# Patient Record
Sex: Female | Born: 1945 | Race: White | Hispanic: No | Marital: Married | Smoking: Former smoker
Health system: Southern US, Community
[De-identification: ages and names within clinical notes are randomized; demographics above are authoritative.]

## PROBLEM LIST (undated history)

## (undated) DIAGNOSIS — J329 Chronic sinusitis, unspecified: Secondary | ICD-10-CM

## (undated) DIAGNOSIS — M549 Dorsalgia, unspecified: Secondary | ICD-10-CM

## (undated) DIAGNOSIS — Z9889 Other specified postprocedural states: Secondary | ICD-10-CM

## (undated) DIAGNOSIS — M7989 Other specified soft tissue disorders: Secondary | ICD-10-CM

## (undated) DIAGNOSIS — M545 Low back pain, unspecified: Secondary | ICD-10-CM

## (undated) DIAGNOSIS — E7800 Pure hypercholesterolemia, unspecified: Secondary | ICD-10-CM

## (undated) DIAGNOSIS — E669 Obesity, unspecified: Secondary | ICD-10-CM

## (undated) DIAGNOSIS — R002 Palpitations: Secondary | ICD-10-CM

## (undated) DIAGNOSIS — Z8679 Personal history of other diseases of the circulatory system: Secondary | ICD-10-CM

## (undated) DIAGNOSIS — M2550 Pain in unspecified joint: Secondary | ICD-10-CM

## (undated) DIAGNOSIS — M1990 Unspecified osteoarthritis, unspecified site: Secondary | ICD-10-CM

## (undated) DIAGNOSIS — K5900 Constipation, unspecified: Secondary | ICD-10-CM

## (undated) DIAGNOSIS — E559 Vitamin D deficiency, unspecified: Secondary | ICD-10-CM

## (undated) DIAGNOSIS — M154 Erosive (osteo)arthritis: Secondary | ICD-10-CM

## (undated) DIAGNOSIS — R112 Nausea with vomiting, unspecified: Secondary | ICD-10-CM

## (undated) DIAGNOSIS — R0602 Shortness of breath: Secondary | ICD-10-CM

## (undated) DIAGNOSIS — G4700 Insomnia, unspecified: Secondary | ICD-10-CM

## (undated) DIAGNOSIS — I10 Essential (primary) hypertension: Secondary | ICD-10-CM

## (undated) DIAGNOSIS — H04129 Dry eye syndrome of unspecified lacrimal gland: Secondary | ICD-10-CM

## (undated) HISTORY — DX: Insomnia, unspecified: G47.00

## (undated) HISTORY — DX: Dorsalgia, unspecified: M54.9

## (undated) HISTORY — DX: Vitamin D deficiency, unspecified: E55.9

## (undated) HISTORY — DX: Palpitations: R00.2

## (undated) HISTORY — PX: EYE SURGERY: SHX253

## (undated) HISTORY — PX: BREAST EXCISIONAL BIOPSY: SUR124

## (undated) HISTORY — DX: Shortness of breath: R06.02

## (undated) HISTORY — DX: Pain in unspecified joint: M25.50

## (undated) HISTORY — DX: Other specified soft tissue disorders: M79.89

## (undated) HISTORY — DX: Obesity, unspecified: E66.9

## (undated) HISTORY — PX: NECK SURGERY: SHX720

## (undated) HISTORY — DX: Dry eye syndrome of unspecified lacrimal gland: H04.129

## (undated) HISTORY — DX: Erosive (osteo)arthritis: M15.4

## (undated) HISTORY — DX: Constipation, unspecified: K59.00

## (undated) HISTORY — PX: TUBAL LIGATION: SHX77

## (undated) HISTORY — DX: Low back pain, unspecified: M54.50

## (undated) HISTORY — DX: Pure hypercholesterolemia, unspecified: E78.00

## (undated) HISTORY — PX: BREAST BIOPSY: SHX20

## (undated) HISTORY — PX: OTHER SURGICAL HISTORY: SHX169

---

## 1998-01-28 ENCOUNTER — Ambulatory Visit (HOSPITAL_COMMUNITY): Admission: RE | Admit: 1998-01-28 | Discharge: 1998-01-28 | Payer: Self-pay | Admitting: Obstetrics and Gynecology

## 1998-09-19 DIAGNOSIS — Z8679 Personal history of other diseases of the circulatory system: Secondary | ICD-10-CM

## 1998-09-19 HISTORY — DX: Personal history of other diseases of the circulatory system: Z86.79

## 1998-12-10 ENCOUNTER — Other Ambulatory Visit: Admission: RE | Admit: 1998-12-10 | Discharge: 1998-12-10 | Payer: Self-pay | Admitting: Obstetrics and Gynecology

## 1999-01-18 ENCOUNTER — Emergency Department (HOSPITAL_COMMUNITY): Admission: EM | Admit: 1999-01-18 | Discharge: 1999-01-18 | Payer: Self-pay | Admitting: Emergency Medicine

## 1999-03-19 ENCOUNTER — Ambulatory Visit (HOSPITAL_COMMUNITY): Admission: RE | Admit: 1999-03-19 | Discharge: 1999-03-19 | Payer: Self-pay | Admitting: Obstetrics and Gynecology

## 1999-08-29 ENCOUNTER — Emergency Department (HOSPITAL_COMMUNITY): Admission: EM | Admit: 1999-08-29 | Discharge: 1999-08-29 | Payer: Self-pay | Admitting: *Deleted

## 1999-12-15 ENCOUNTER — Encounter: Payer: Self-pay | Admitting: Internal Medicine

## 1999-12-15 ENCOUNTER — Ambulatory Visit (HOSPITAL_COMMUNITY): Admission: RE | Admit: 1999-12-15 | Discharge: 1999-12-16 | Payer: Self-pay | Admitting: Neurology

## 2000-04-07 ENCOUNTER — Encounter: Payer: Self-pay | Admitting: Internal Medicine

## 2000-04-07 ENCOUNTER — Ambulatory Visit (HOSPITAL_COMMUNITY): Admission: RE | Admit: 2000-04-07 | Discharge: 2000-04-07 | Payer: Self-pay | Admitting: Internal Medicine

## 2000-12-13 ENCOUNTER — Other Ambulatory Visit: Admission: RE | Admit: 2000-12-13 | Discharge: 2000-12-13 | Payer: Self-pay | Admitting: Obstetrics and Gynecology

## 2000-12-22 ENCOUNTER — Encounter: Admission: RE | Admit: 2000-12-22 | Discharge: 2000-12-22 | Payer: Self-pay | Admitting: Obstetrics and Gynecology

## 2000-12-22 ENCOUNTER — Encounter: Payer: Self-pay | Admitting: Obstetrics and Gynecology

## 2000-12-29 ENCOUNTER — Encounter: Admission: RE | Admit: 2000-12-29 | Discharge: 2000-12-29 | Payer: Self-pay | Admitting: Obstetrics and Gynecology

## 2000-12-29 ENCOUNTER — Encounter: Payer: Self-pay | Admitting: Obstetrics and Gynecology

## 2001-12-13 ENCOUNTER — Other Ambulatory Visit: Admission: RE | Admit: 2001-12-13 | Discharge: 2001-12-13 | Payer: Self-pay | Admitting: Obstetrics and Gynecology

## 2002-01-07 ENCOUNTER — Encounter: Admission: RE | Admit: 2002-01-07 | Discharge: 2002-01-07 | Payer: Self-pay | Admitting: Obstetrics and Gynecology

## 2002-01-07 ENCOUNTER — Encounter: Payer: Self-pay | Admitting: Obstetrics and Gynecology

## 2002-05-17 ENCOUNTER — Ambulatory Visit (HOSPITAL_COMMUNITY): Admission: RE | Admit: 2002-05-17 | Discharge: 2002-05-17 | Payer: Self-pay | Admitting: Gastroenterology

## 2002-12-18 ENCOUNTER — Other Ambulatory Visit: Admission: RE | Admit: 2002-12-18 | Discharge: 2002-12-18 | Payer: Self-pay | Admitting: *Deleted

## 2003-01-17 ENCOUNTER — Encounter: Payer: Self-pay | Admitting: Obstetrics and Gynecology

## 2003-01-17 ENCOUNTER — Encounter: Admission: RE | Admit: 2003-01-17 | Discharge: 2003-01-17 | Payer: Self-pay | Admitting: Obstetrics and Gynecology

## 2003-12-31 ENCOUNTER — Other Ambulatory Visit: Admission: RE | Admit: 2003-12-31 | Discharge: 2003-12-31 | Payer: Self-pay | Admitting: Obstetrics and Gynecology

## 2004-02-06 ENCOUNTER — Encounter: Admission: RE | Admit: 2004-02-06 | Discharge: 2004-02-06 | Payer: Self-pay | Admitting: Obstetrics and Gynecology

## 2004-06-04 ENCOUNTER — Encounter: Admission: RE | Admit: 2004-06-04 | Discharge: 2004-06-04 | Payer: Self-pay | Admitting: Internal Medicine

## 2005-01-07 ENCOUNTER — Other Ambulatory Visit: Admission: RE | Admit: 2005-01-07 | Discharge: 2005-01-07 | Payer: Self-pay | Admitting: Obstetrics and Gynecology

## 2005-01-14 ENCOUNTER — Encounter: Admission: RE | Admit: 2005-01-14 | Discharge: 2005-01-14 | Payer: Self-pay | Admitting: Obstetrics and Gynecology

## 2006-01-11 ENCOUNTER — Other Ambulatory Visit: Admission: RE | Admit: 2006-01-11 | Discharge: 2006-01-11 | Payer: Self-pay | Admitting: Obstetrics and Gynecology

## 2006-01-18 ENCOUNTER — Encounter: Payer: Self-pay | Admitting: Obstetrics and Gynecology

## 2006-02-09 ENCOUNTER — Encounter: Admission: RE | Admit: 2006-02-09 | Discharge: 2006-02-09 | Payer: Self-pay | Admitting: Obstetrics and Gynecology

## 2007-01-17 ENCOUNTER — Other Ambulatory Visit: Admission: RE | Admit: 2007-01-17 | Discharge: 2007-01-17 | Payer: Self-pay | Admitting: Obstetrics and Gynecology

## 2007-01-24 ENCOUNTER — Encounter: Admission: RE | Admit: 2007-01-24 | Discharge: 2007-01-24 | Payer: Self-pay | Admitting: Obstetrics and Gynecology

## 2008-01-22 ENCOUNTER — Other Ambulatory Visit: Admission: RE | Admit: 2008-01-22 | Discharge: 2008-01-22 | Payer: Self-pay | Admitting: Obstetrics and Gynecology

## 2008-02-04 ENCOUNTER — Encounter: Admission: RE | Admit: 2008-02-04 | Discharge: 2008-02-04 | Payer: Self-pay | Admitting: Obstetrics and Gynecology

## 2009-01-22 ENCOUNTER — Other Ambulatory Visit: Admission: RE | Admit: 2009-01-22 | Discharge: 2009-01-22 | Payer: Self-pay | Admitting: Obstetrics and Gynecology

## 2009-02-10 ENCOUNTER — Encounter: Admission: RE | Admit: 2009-02-10 | Discharge: 2009-02-10 | Payer: Self-pay | Admitting: Obstetrics and Gynecology

## 2010-02-26 ENCOUNTER — Encounter: Admission: RE | Admit: 2010-02-26 | Discharge: 2010-02-26 | Payer: Self-pay | Admitting: Internal Medicine

## 2010-11-24 ENCOUNTER — Other Ambulatory Visit (HOSPITAL_COMMUNITY)
Admission: RE | Admit: 2010-11-24 | Discharge: 2010-11-24 | Disposition: A | Discharge status: Home / Self Care | Payer: BC Managed Care – PPO | Source: Ambulatory Visit | Attending: Obstetrics and Gynecology | Admitting: Obstetrics and Gynecology

## 2010-11-24 ENCOUNTER — Other Ambulatory Visit: Payer: Self-pay | Admitting: Obstetrics and Gynecology

## 2010-11-24 DIAGNOSIS — Z1159 Encounter for screening for other viral diseases: Secondary | ICD-10-CM | POA: Insufficient documentation

## 2010-11-24 DIAGNOSIS — Z01419 Encounter for gynecological examination (general) (routine) without abnormal findings: Secondary | ICD-10-CM | POA: Insufficient documentation

## 2011-02-04 NOTE — Procedures (Signed)
[facility]  Patient:    Laura Haas, Laura Haas                       MRN: [id number] Proc. Date: 12/15/99 Adm. Date:  [id number] Attending:  Nathen May CC:         Pearla Dubonnet, M.D.             Electrophysiology Laboratory                           Procedure Report  PREOPERATIVE DIAGNOSIS:  Supraventricular tachycardia.  POSTOPERATIVE DIAGNOSIS:  Supraventricular tachycardia.  PROCEDURE:  Invasive electrophysiological study, arrhythmia mapping, intracardiac ultrasound and radiofrequency catheter ablation.  SURGEON:  Nathen May, M.D.  DESCRIPTION OF PROCEDURE:  Following attainment of informed consent, the patient was brought to the electrophysiology laboratory and placed on the fluoroscopy table in the supine position.  After routine prep and drape, cardiac catheterization as performed with local anesthesia and conscious sedation.  Noninvasive blood pressure monitoring, transcutaneous oxygen saturation monitoring and end-tidal CO2 monitoring were performed continuously throughout the procedure.  Following the  procedure, the catheters were removed, hemostasis was obtained and the patient as transferred to the floor in stable condition.  CATHETERS:  A 5-French quadripolar catheter was inserted via the left femoral vein to the high right atrium.  A 5-French quadripolar catheter was inserted via the left femoral vein to the A-V junction.  A 5-French quadripolar catheter was inserted via the left femoral vein to the right ventricular apex.  A 6-French octapolar catheter was inserted via the right femoral vein to the coronary sinus.  A 7-French 4-mm deflectable-tip ablation catheter was inserted via the right femoral vein using a Riley Kill SR-0 sheath to mapping sites in the posterior septal space.  An 8-French 9 mHz intracardiac echo probe was inserted via the right femoral vein to the right atrium.  Surface  leads I, aVF and V1 were monitored continuously throughout the procedure. Following insertion of the catheters, the stimulation protocol included incremental atrial pacing, incremental ventricular pacing, single and double extra-atrial stimuli at 500 msec.  RESULTS: Surface electrocardiogram:  Rhythm is sinus and sinus.  Cycle length was 652 msec initial and 712 msec final.  P-R wave duration was 105 msec initial and 129 msec final.  P-R interval was 149 msec initial and 149 msec final.  QRS duration was 94 msec initial and 88 msec final.  Q-T interval was 355 msec initial and 378 msec final.  P wave duration was 114 msec initial and 114 msec final.  Bundle branch block is absent and absent.  Preexcitation is absent and absent.  A-V NODAL FUNCTION:  (Initial and final) A-V Wenckebach was 300 msec initial and 370 msec final.  V-A conduction was 300 msec at the end of pre-ablation.  The A-H interval pre-ablation was 74 msec and post-ablation was 86 msec.  The A-V nodal effective refractory period at a paced cycle length of 500 msec was 290 msec in the fast pathway and was less than 220 msec in the slow pathway. Post-ablation A-V nodal effective refractoriness in the fast pathway was less-than-or-equal-to 280 msec, representing a functional refractory period at he atrium and in this setting with stimulation with double atrial extrastimuli, no  slow pathway conduction was identified.  HIS-PURKINJE SYSTEM FUNCTION: The H-V interval was 50 msec initial and 47 msec final.  His bundle duration was 17 msec  initial and 24 msec final.  ACCESSORY PATHWAY FUNCTION:  No evidence of an accessory pathway was identified.  ARRHYTHMIAS INDUCED:  Typical slow-fast A-V nodal reentry tachycardia was inducible reproducibly with atrial burst pacing as well as single atrial extrastimuli at 500:250-240.  It was terminated reproducibly with atrial pacing to 250 msec.  During a typical  episode of tachycardia, the A-H interval was 276 msec, the H-A  interval was 40 msec.  Tachycardia cycle lengths ranged from 300 to 270 msec.  CHARACTERISTICS OF TACHYCARDIA:  No ability to preexcite the atrium during His bundle refractoriness with ventricular stimulation.  In addition, right bundle branch block aberration was unassociated with V-A prolongation.  FLUOROSCOPY TIME:  A total of 10 minutes of fluoroscopy time was utilized at 17.5 frames per second.  INTRACARDIAC ULTRASOUND:  A 9 mHz transducer was utilized via a long sheath inserted via the right femoral vein to visualize the confluence of the heart between the tricuspid annulus and the coronary sinus os.  This was undertaken because of close proximity of the coronary sinus and the A-V node.  Because of he insertion of an additional catheter, 2500 units of intravenous heparin were given at this juncture.  RADIOFREQUENCY ENERGY:  A total of 42 seconds of radiofrequency energy were applied to sites identified by intracardiac echo between the tricuspid annulus and the coronary sinus.  Intracardiac echo was utilized because of proximity of the coronary sinus off to the His bundle catheter.  Junctional rhythm ensued. After 30 seconds, the catheter was withdrawn approximately 3 to 4 mm; junctional rhythm redeveloped but after a total of 42 seconds, RF energy was terminated because of dislodgement of the His catheter.  Following ablation, no evidence of antegrade  slow pathway function was identifiable.  IMPRESSION: 1. Normal sinus function. 2. Normal atrial function. 3. 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    Following radiofrequency catheter ablation, no slow pathway antegrade function    was identifiable. 4. Normal His-Purkinje system function. 5. No accessory pathway. 6. Normal  ventricular response to programmed stimulation, as described above.   SUMMARY AND CONCLUSION:  The results of electrophysiological testing identified  typical slow-fast A-V nodal reentry tachycardia as the mechanism underlying Laura Haas tachycardia.  Radiofrequency catheter ablation under intracardiac echo guidance successfully eliminated slow pathway antegrade conduction and therefore the substrate for the patients A-V nodal reentry tachycardia.  RECOMMENDATION: 1. Observe overnight. 2. Endocarditis prophylaxis for six weeks. 3. Aspirin daily for three months. 4. Wean atenolol over 10 days. DD:  12/15/99 TD:  12/16/99 Job: [id number]

## 2011-02-16 ENCOUNTER — Other Ambulatory Visit: Payer: Self-pay | Admitting: Internal Medicine

## 2011-02-16 DIAGNOSIS — Z1231 Encounter for screening mammogram for malignant neoplasm of breast: Secondary | ICD-10-CM

## 2011-03-02 ENCOUNTER — Ambulatory Visit: Payer: BC Managed Care – PPO

## 2011-03-10 ENCOUNTER — Ambulatory Visit
Admission: RE | Admit: 2011-03-10 | Discharge: 2011-03-10 | Disposition: A | Discharge status: Home / Self Care | Payer: BC Managed Care – PPO | Source: Ambulatory Visit | Attending: Internal Medicine | Admitting: Internal Medicine

## 2011-03-10 DIAGNOSIS — Z1231 Encounter for screening mammogram for malignant neoplasm of breast: Secondary | ICD-10-CM

## 2012-02-10 ENCOUNTER — Other Ambulatory Visit: Payer: Self-pay | Admitting: Internal Medicine

## 2012-02-10 DIAGNOSIS — Z1231 Encounter for screening mammogram for malignant neoplasm of breast: Secondary | ICD-10-CM

## 2012-03-12 ENCOUNTER — Ambulatory Visit: Payer: BC Managed Care – PPO

## 2012-03-14 ENCOUNTER — Ambulatory Visit
Admission: RE | Admit: 2012-03-14 | Discharge: 2012-03-14 | Disposition: A | Discharge status: Home / Self Care | Payer: BC Managed Care – PPO | Source: Ambulatory Visit | Attending: Internal Medicine | Admitting: Internal Medicine

## 2012-03-14 DIAGNOSIS — Z1231 Encounter for screening mammogram for malignant neoplasm of breast: Secondary | ICD-10-CM

## 2013-02-20 ENCOUNTER — Other Ambulatory Visit: Payer: Self-pay

## 2013-02-20 DIAGNOSIS — Z1231 Encounter for screening mammogram for malignant neoplasm of breast: Secondary | ICD-10-CM

## 2013-03-05 ENCOUNTER — Other Ambulatory Visit: Payer: Self-pay | Admitting: Gastroenterology

## 2013-03-26 ENCOUNTER — Ambulatory Visit
Admission: RE | Admit: 2013-03-26 | Discharge: 2013-03-26 | Disposition: A | Discharge status: Home / Self Care | Payer: BC Managed Care – PPO | Source: Ambulatory Visit

## 2013-03-26 DIAGNOSIS — Z1231 Encounter for screening mammogram for malignant neoplasm of breast: Secondary | ICD-10-CM

## 2013-03-29 ENCOUNTER — Encounter (HOSPITAL_COMMUNITY): Payer: Self-pay | Admitting: Pharmacy Technician

## 2013-03-29 ENCOUNTER — Encounter (HOSPITAL_COMMUNITY): Payer: Self-pay | Admitting: *Deleted

## 2013-04-10 NOTE — Progress Notes (Signed)
EKG from 02/25/2011 from [facility], Chest x-ray 2 view from [facility] 12/18/2012, and Labs ( U/A, BMP,CBC w/ diff.,Hepatic Panel,Lipid without trig., TSH, Vit.D all on chart.

## 2013-05-21 ENCOUNTER — Ambulatory Visit (HOSPITAL_COMMUNITY)
Admission: RE | Admit: 2013-05-21 | Discharge: 2013-05-21 | Disposition: A | Discharge status: Home / Self Care | Payer: BC Managed Care – PPO | Source: Ambulatory Visit | Attending: Gastroenterology | Admitting: Gastroenterology

## 2013-05-21 ENCOUNTER — Encounter (HOSPITAL_COMMUNITY): Payer: Self-pay | Admitting: Anesthesiology

## 2013-05-21 ENCOUNTER — Encounter (HOSPITAL_COMMUNITY): Payer: Self-pay | Admitting: *Deleted

## 2013-05-21 ENCOUNTER — Ambulatory Visit (HOSPITAL_COMMUNITY): Payer: BC Managed Care – PPO | Admitting: Anesthesiology

## 2013-05-21 ENCOUNTER — Encounter (HOSPITAL_COMMUNITY)
Admission: RE | Disposition: A | Discharge status: Home / Self Care | Payer: Self-pay | Source: Ambulatory Visit | Attending: Gastroenterology

## 2013-05-21 DIAGNOSIS — E669 Obesity, unspecified: Secondary | ICD-10-CM | POA: Insufficient documentation

## 2013-05-21 DIAGNOSIS — I10 Essential (primary) hypertension: Secondary | ICD-10-CM | POA: Insufficient documentation

## 2013-05-21 DIAGNOSIS — Z6835 Body mass index (BMI) 35.0-35.9, adult: Secondary | ICD-10-CM | POA: Insufficient documentation

## 2013-05-21 DIAGNOSIS — IMO0001 Reserved for inherently not codable concepts without codable children: Secondary | ICD-10-CM | POA: Insufficient documentation

## 2013-05-21 DIAGNOSIS — Z79899 Other long term (current) drug therapy: Secondary | ICD-10-CM | POA: Insufficient documentation

## 2013-05-21 DIAGNOSIS — E785 Hyperlipidemia, unspecified: Secondary | ICD-10-CM | POA: Insufficient documentation

## 2013-05-21 DIAGNOSIS — Z91041 Radiographic dye allergy status: Secondary | ICD-10-CM | POA: Insufficient documentation

## 2013-05-21 DIAGNOSIS — Z888 Allergy status to other drugs, medicaments and biological substances status: Secondary | ICD-10-CM | POA: Insufficient documentation

## 2013-05-21 DIAGNOSIS — K5730 Diverticulosis of large intestine without perforation or abscess without bleeding: Secondary | ICD-10-CM | POA: Insufficient documentation

## 2013-05-21 DIAGNOSIS — Z1211 Encounter for screening for malignant neoplasm of colon: Secondary | ICD-10-CM | POA: Insufficient documentation

## 2013-05-21 HISTORY — DX: Other specified postprocedural states: Z98.890

## 2013-05-21 HISTORY — PX: COLONOSCOPY WITH PROPOFOL: SHX5780

## 2013-05-21 HISTORY — DX: Chronic sinusitis, unspecified: J32.9

## 2013-05-21 HISTORY — DX: Nausea with vomiting, unspecified: R11.2

## 2013-05-21 HISTORY — DX: Unspecified osteoarthritis, unspecified site: M19.90

## 2013-05-21 HISTORY — DX: Personal history of other diseases of the circulatory system: Z86.79

## 2013-05-21 HISTORY — DX: Essential (primary) hypertension: I10

## 2013-05-21 SURGERY — COLONOSCOPY WITH PROPOFOL
Anesthesia: Monitor Anesthesia Care

## 2013-05-21 MED ORDER — SODIUM CHLORIDE 0.9 % IV SOLN: INTRAVENOUS | Status: DC

## 2013-05-21 MED ORDER — ONDANSETRON HCL 4 MG/2ML IJ SOLN
INTRAMUSCULAR | Status: DC | PRN
  Administered 2013-05-21: 4 mg via INTRAVENOUS

## 2013-05-21 MED ORDER — PROPOFOL 10 MG/ML IV BOLUS
INTRAVENOUS | Status: DC | PRN
  Administered 2013-05-21: 60 mg via INTRAVENOUS
  Administered 2013-05-21: 40 mg via INTRAVENOUS

## 2013-05-21 MED ORDER — PROPOFOL INFUSION 10 MG/ML OPTIME
INTRAVENOUS | Status: DC | PRN
  Administered 2013-05-21: 140 ug/kg/min via INTRAVENOUS

## 2013-05-21 MED ORDER — LACTATED RINGERS IV SOLN
INTRAVENOUS | Status: DC
  Administered 2013-05-21: 1000 mL via INTRAVENOUS

## 2013-05-21 SURGICAL SUPPLY — 22 items

## 2013-05-21 NOTE — H&P (Signed)
  Procedure: Screening colonoscopy  History: The patient is a 67 year old female born Feb 04, 1946. She underwent a normal screening colonoscopy in August 2003. She is scheduled to undergo a repeat screening colonoscopy today.  Past medical history: Hypertension. Hypercholesterolemia. Fibrocystic breast disease. Episodic vertigo. Universal colonic diverticulosis. Bilateral cataracts. Left cataract surgery. History of recurrent supraventricular tachycardia. Radio ablation for supraventricular tachycardia. Tubal ligation.  Allergies: Allegra. Lisinopril. Iodinated contrast dye. Pravastatin.  Exam: The patient is alert and lying comfortably on the endoscopy stretcher. Lungs are clear to auscultation. Cardiac exam reveals a regular rhythm. Abdomen is soft and nontender to palpation.  Plan: Proceed with screening colonoscopy

## 2013-05-21 NOTE — Preoperative (Signed)
Beta Blockers   Reason not to administer Beta Blockers:Not Applicable 

## 2013-05-21 NOTE — Anesthesia Postprocedure Evaluation (Signed)
Anesthesia Post Note  Patient: Laura Haas  Procedure(s) Performed: Procedure(s) (LRB): COLONOSCOPY WITH PROPOFOL (N/A)  Anesthesia type: MAC  Patient location: PACU  Post pain: Pain level controlled  Post assessment: Post-op Vital signs reviewed  Last Vitals: BP 119/72  Pulse 73  Temp(Src) 36.6 C (Oral)  Resp 18  Ht 5\' 4"  (1.626 m)  Wt 204 lb (92.534 kg)  BMI 35 kg/m2  SpO2 98%  Post vital signs: Reviewed  Level of consciousness: awake  Complications: No apparent anesthesia complications

## 2013-05-21 NOTE — Transfer of Care (Signed)
Immediate Anesthesia Transfer of Care Note  Patient: Laura Haas  Procedure(s) Performed: Procedure(s) (LRB): COLONOSCOPY WITH PROPOFOL (N/A)  Patient Location: PACU  Anesthesia Type: MAC  Level of Consciousness: sedated, patient cooperative and responds to stimulaton  Airway & Oxygen Therapy: Patient Spontanous Breathing and Patient connected to face mask oxgen  Post-op Assessment: Report given to PACU RN and Post -op Vital signs reviewed and stable  Post vital signs: Reviewed and stable  Complications: No apparent anesthesia complications

## 2013-05-21 NOTE — Anesthesia Preprocedure Evaluation (Addendum)
Anesthesia Evaluation  Patient identified by MRN, date of birth, ID band Patient awake    Reviewed: Allergy & Precautions, H&P , NPO status , Patient's Chart, lab work & pertinent test results  Airway Mallampati: II TM Distance: >3 FB Neck ROM: Full    Dental  (+) Dental Advisory Given and Teeth Intact   Pulmonary neg pulmonary ROS,  breath sounds clear to auscultation        Cardiovascular hypertension, Pt. on medications Rhythm:Regular Rate:Normal     Neuro/Psych negative neurological ROS  negative psych ROS   GI/Hepatic negative GI ROS, Neg liver ROS,   Endo/Other  negative endocrine ROS  Renal/GU negative Renal ROS     Musculoskeletal negative musculoskeletal ROS (+)   Abdominal (+) + obese,   Peds  Hematology negative hematology ROS (+)   Anesthesia Other Findings   Reproductive/Obstetrics negative OB ROS                          Anesthesia Physical Anesthesia Plan  ASA: II  Anesthesia Plan: MAC   Post-op Pain Management:    Induction: Intravenous  Airway Management Planned:   Additional Equipment:   Intra-op Plan:   Post-operative Plan:   Informed Consent: I have reviewed the patients History and Physical, chart, labs and discussed the procedure including the risks, benefits and alternatives for the proposed anesthesia with the patient or authorized representative who has indicated his/her understanding and acceptance.   Dental advisory given  Plan Discussed with: CRNA  Anesthesia Plan Comments:         Anesthesia Quick Evaluation

## 2013-05-21 NOTE — Op Note (Signed)
Procedure: Screening colonoscopy  Endoscopist: Danise Edge  Premedication: Propofol administered by anesthesia  Procedure: The patient was placed in the left lateral decubitus position. Anal inspection and digital rectal exam were normal. The Pentax pediatric colonoscope was introduced into the rectum and advanced to the cecum. A normal-appearing appendiceal orifice and ileocecal valve were identified. Colonic preparation for the exam today was good. Universal colonic diverticulosis was present.  Rectum. Normal. Retroflex view of the distal rectum normal.  Sigmoid colon and descending colon. Normal.  Splenic flexure. Normal.  Transverse colon. Normal.  Hepatic flexure. Normal.  Ascending colon. Normal.  Cecum and ileocecal valve. Normal.  Assessment: Normal screening proctocolonoscopy to the cecum. Universal colonic diverticulosis present.  Recommendations: Schedule repeat screening colonoscopy in 10 years.

## 2013-05-22 ENCOUNTER — Encounter (HOSPITAL_COMMUNITY): Payer: Self-pay | Admitting: Gastroenterology

## 2013-12-12 ENCOUNTER — Other Ambulatory Visit: Payer: Self-pay | Admitting: Nurse Practitioner

## 2013-12-12 ENCOUNTER — Other Ambulatory Visit (HOSPITAL_COMMUNITY)
Admission: RE | Admit: 2013-12-12 | Discharge: 2013-12-12 | Disposition: A | Discharge status: Home / Self Care | Payer: BC Managed Care – PPO | Source: Ambulatory Visit | Attending: Nurse Practitioner | Admitting: Nurse Practitioner

## 2013-12-12 DIAGNOSIS — Z1151 Encounter for screening for human papillomavirus (HPV): Secondary | ICD-10-CM | POA: Insufficient documentation

## 2013-12-12 DIAGNOSIS — Z01419 Encounter for gynecological examination (general) (routine) without abnormal findings: Secondary | ICD-10-CM | POA: Insufficient documentation

## 2014-03-18 ENCOUNTER — Other Ambulatory Visit: Payer: Self-pay

## 2014-03-18 DIAGNOSIS — Z1231 Encounter for screening mammogram for malignant neoplasm of breast: Secondary | ICD-10-CM

## 2014-04-15 ENCOUNTER — Ambulatory Visit
Admission: RE | Admit: 2014-04-15 | Discharge: 2014-04-15 | Disposition: A | Discharge status: Home / Self Care | Payer: BC Managed Care – PPO | Source: Ambulatory Visit

## 2014-04-15 ENCOUNTER — Encounter (INDEPENDENT_AMBULATORY_CARE_PROVIDER_SITE_OTHER): Payer: Self-pay

## 2014-04-15 DIAGNOSIS — Z1231 Encounter for screening mammogram for malignant neoplasm of breast: Secondary | ICD-10-CM

## 2014-12-03 ENCOUNTER — Other Ambulatory Visit: Payer: Self-pay | Admitting: Dermatology

## 2015-02-24 ENCOUNTER — Ambulatory Visit (INDEPENDENT_AMBULATORY_CARE_PROVIDER_SITE_OTHER): Payer: Medicare Other

## 2015-02-24 DIAGNOSIS — R002 Palpitations: Secondary | ICD-10-CM | POA: Insufficient documentation

## 2015-03-16 ENCOUNTER — Other Ambulatory Visit: Payer: Self-pay

## 2015-03-19 ENCOUNTER — Other Ambulatory Visit: Payer: Self-pay

## 2015-03-19 DIAGNOSIS — Z1231 Encounter for screening mammogram for malignant neoplasm of breast: Secondary | ICD-10-CM

## 2015-04-22 ENCOUNTER — Ambulatory Visit
Admission: RE | Admit: 2015-04-22 | Discharge: 2015-04-22 | Disposition: A | Discharge status: Home / Self Care | Payer: Medicare Other | Source: Ambulatory Visit

## 2015-04-22 DIAGNOSIS — Z1231 Encounter for screening mammogram for malignant neoplasm of breast: Secondary | ICD-10-CM

## 2016-03-28 ENCOUNTER — Other Ambulatory Visit: Payer: Self-pay | Admitting: Internal Medicine

## 2016-03-28 DIAGNOSIS — Z1231 Encounter for screening mammogram for malignant neoplasm of breast: Secondary | ICD-10-CM

## 2016-04-22 ENCOUNTER — Ambulatory Visit
Admission: RE | Admit: 2016-04-22 | Discharge: 2016-04-22 | Disposition: A | Discharge status: Home / Self Care | Payer: Medicare Other | Source: Ambulatory Visit | Attending: Internal Medicine | Admitting: Internal Medicine

## 2016-04-22 DIAGNOSIS — Z1231 Encounter for screening mammogram for malignant neoplasm of breast: Secondary | ICD-10-CM

## 2017-03-13 ENCOUNTER — Other Ambulatory Visit: Payer: Self-pay | Admitting: Internal Medicine

## 2017-03-13 DIAGNOSIS — Z1231 Encounter for screening mammogram for malignant neoplasm of breast: Secondary | ICD-10-CM

## 2017-04-24 ENCOUNTER — Ambulatory Visit
Admission: RE | Admit: 2017-04-24 | Discharge: 2017-04-24 | Disposition: A | Discharge status: Home / Self Care | Payer: Medicare Other | Source: Ambulatory Visit | Attending: Internal Medicine | Admitting: Internal Medicine

## 2017-04-24 DIAGNOSIS — Z1231 Encounter for screening mammogram for malignant neoplasm of breast: Secondary | ICD-10-CM

## 2017-05-30 ENCOUNTER — Other Ambulatory Visit: Payer: Self-pay | Admitting: Internal Medicine

## 2017-05-30 DIAGNOSIS — R0602 Shortness of breath: Secondary | ICD-10-CM

## 2017-06-14 ENCOUNTER — Ambulatory Visit (INDEPENDENT_AMBULATORY_CARE_PROVIDER_SITE_OTHER): Payer: Medicare Other

## 2017-06-14 DIAGNOSIS — R0602 Shortness of breath: Secondary | ICD-10-CM | POA: Diagnosis not present

## 2017-06-14 LAB — EXERCISE TOLERANCE TEST
CHL CUP RESTING HR STRESS: 84 {beats}/min
CSEPED: 5 min
CSEPEW: 7 METS
CSEPPHR: 144 {beats}/min
Exercise duration (sec): 33 s
MPHR: 150 {beats}/min
Percent HR: 96 %
RPE: 17

## 2017-11-08 ENCOUNTER — Ambulatory Visit: Payer: Self-pay | Admitting: Podiatry

## 2017-11-08 ENCOUNTER — Ambulatory Visit (INDEPENDENT_AMBULATORY_CARE_PROVIDER_SITE_OTHER): Payer: Medicare Other

## 2017-11-08 VITALS — Ht 64.0 in | Wt 200.0 lb

## 2017-11-08 DIAGNOSIS — M779 Enthesopathy, unspecified: Secondary | ICD-10-CM | POA: Diagnosis not present

## 2017-11-08 DIAGNOSIS — M7661 Achilles tendinitis, right leg: Secondary | ICD-10-CM

## 2017-11-08 MED ORDER — METHYLPREDNISOLONE 4 MG PO TBPK: ORAL_TABLET | ORAL | 0 refills | Status: DC

## 2017-11-08 MED ORDER — MELOXICAM 15 MG PO TABS: 15.0000 mg | ORAL_TABLET | Freq: Every day | ORAL | 1 refills | Status: AC

## 2017-11-08 NOTE — Progress Notes (Signed)
   Subjective:    Patient ID: Laura Haas, female    DOB: May 16, 1946, 72 y.o.   MRN: [id number]  HPI  Chief Complaint  Patient presents with  . Foot Pain    right ankle and foot pain x 2 months. pain is worst after walking a long time. Hx of PF and recent Dx of osteoarthritis       Review of Systems  All other systems reviewed and are negative.      Objective:   Physical Exam        Assessment & Plan:

## 2017-11-10 NOTE — Progress Notes (Signed)
   HPI: 72 year old female presenting today for pain to the right achilles and ankle that began 2 months ago. She states she is beginning to have pain in the left foot as well. She states the pain is worse in the mornings and after walking for long distances. Walking up and down stairs is extremely difficult for patient due to the pain. She has been icing the area with some relief. Patient is here for further evaluation and treatment.   Past Medical History:  Diagnosis Date  . Arthritis   . Hypertension   . PONV (postoperative nausea and vomiting)    during catract surgery  . S/P catheter ablation of slow pathway 2000   for tachacardia  . Sinus infection    3 weeks ago      Physical Exam: General: The patient is alert and oriented x3 in no acute distress.  Dermatology: Skin is warm, dry and supple bilateral lower extremities. Negative for open lesions or macerations.  Vascular: Palpable pedal pulses bilaterally. No edema or erythema noted. Capillary refill within normal limits.  Neurological: Epicritic and protective threshold grossly intact bilaterally.   Musculoskeletal Exam: Pain on palpation noted to the posterior tubercle of the right calcaneus at the insertion of the Achilles tendon consistent with retrocalcaneal bursitis. Range of motion within normal limits. Muscle strength 5/5 in all muscle groups bilateral lower extremities.  Radiographic Exam:  Focal areas of calcification with enlargement of the achilles bilaterally, approximately 5 cm proximal to insertion.      Assessment: 1. Insertional Achilles tendinitis right 2. Retrocalcaneal bursitis   Plan of Care:  1. Patient was evaluated. Radiographs were reviewed today. 2. Prescription for Medrol Dose Pak provided to patient.  3. Prescription for Mobic 15 mg provided to patient.  4. CAM boot dispensed. Weightbearing as tolerated for 4 weeks.  5. Night splint dispensed to wear while sleeping.  6. Continue water  aerobics.  7. Return to clinic in 4 weeks.   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    Edrick Kins, DPM [facility]  Dr. Edrick Kins, [address]                                        [place], [place] [address]                Office [phone]  Fax [phone]

## 2017-12-11 ENCOUNTER — Ambulatory Visit: Payer: Medicare Other | Admitting: Podiatry

## 2017-12-11 DIAGNOSIS — M7661 Achilles tendinitis, right leg: Secondary | ICD-10-CM

## 2017-12-13 NOTE — Progress Notes (Signed)
   HPI: 72 year old female presenting today for follow up evaluation of achilles tendinitis of the right lower extremity. She states she is doing better and her pain has improved. She has been taking Mobic as directed for the symptoms. There are no modifying factors noted. She denies any pain currently. Patient is here for further evaluation and treatment.   Past Medical History:  Diagnosis Date  . Arthritis   . Hypertension   . PONV (postoperative nausea and vomiting)    during catract surgery  . S/P catheter ablation of slow pathway 2000   for tachacardia  . Sinus infection    3 weeks ago     Physical Exam: General: The patient is alert and oriented x3 in no acute distress.  Dermatology: Skin is warm, dry and supple bilateral lower extremities. Negative for open lesions or macerations.  Vascular: Palpable pedal pulses bilaterally. No edema or erythema noted. Capillary refill within normal limits.  Neurological: Epicritic and protective threshold grossly intact bilaterally.   Musculoskeletal Exam: Range of motion within normal limits to all pedal and ankle joints bilateral. Muscle strength 5/5 in all groups bilateral.   Assessment: - insertional achilles tendinitis right - resolved - retrocalcaneal bursitis - resolved   Plan of Care:  - Patient evaluated.  - Discontinue wearing CAM boot.  - Continue taking Meloxicam as needed.  - Recommended good shoe gear.  - Return to clinic as needed.   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 Going on vacation to [place], [place].     Edrick Kins, DPM [facility]  Dr. Edrick Kins, DPM    [address], [place] [address]                Office [phone]  Fax [phone]

## 2018-01-17 ENCOUNTER — Ambulatory Visit: Payer: Medicare Other | Admitting: Podiatry

## 2018-01-17 ENCOUNTER — Encounter: Payer: Self-pay | Admitting: Podiatry

## 2018-01-17 DIAGNOSIS — M7661 Achilles tendinitis, right leg: Secondary | ICD-10-CM | POA: Diagnosis not present

## 2018-01-21 NOTE — Progress Notes (Signed)
   HPI: 72 year old female presenting today for follow up evaluation of right achilles tendinitis. She states the pain began again about three days ago. She reports an associated nodule to the posterior right leg. She has been icing the area for treatment. Walking and standing increases the pain. Patient is here for further evaluation and treatment.   Past Medical History:  Diagnosis Date  . Arthritis   . Hypertension   . PONV (postoperative nausea and vomiting)    during catract surgery  . S/P catheter ablation of slow pathway 2000   for tachacardia  . Sinus infection    3 weeks ago      Physical Exam: General: The patient is alert and oriented x3 in no acute distress.  Dermatology: Skin is warm, dry and supple bilateral lower extremities. Negative for open lesions or macerations.  Vascular: Palpable pedal pulses bilaterally. No edema or erythema noted. Capillary refill within normal limits.  Neurological: Epicritic and protective threshold grossly intact bilaterally.   Musculoskeletal Exam: Pain on palpation noted to the posterior tubercle of the right calcaneus at the insertion of the Achilles tendon consistent with retrocalcaneal bursitis. Range of motion within normal limits. Muscle strength 5/5 in all muscle groups bilateral lower extremities.  Assessment: 1. Achilles tendinitis right 2. Retrocalcaneal bursitis   Plan of Care:  1. Patient was evaluated.  2. Continue weightbearing in CAM boot.  3. Continue taking Meloxicam as directed.  4. Prescription for physical therapy three times a week for 4 weeks.  5. Return to clinic as needed.   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     Edrick Kins, DPM [facility]  Dr. Edrick Kins, [address]                                        [place], [place] [address]                Office [phone]  Fax [phone]

## 2018-03-29 ENCOUNTER — Other Ambulatory Visit: Payer: Self-pay | Admitting: Internal Medicine

## 2018-03-29 DIAGNOSIS — Z1231 Encounter for screening mammogram for malignant neoplasm of breast: Secondary | ICD-10-CM

## 2018-04-27 ENCOUNTER — Ambulatory Visit
Admission: RE | Admit: 2018-04-27 | Discharge: 2018-04-27 | Disposition: A | Discharge status: Home / Self Care | Payer: Medicare Other | Source: Ambulatory Visit | Attending: Internal Medicine | Admitting: Internal Medicine

## 2018-04-27 DIAGNOSIS — Z1231 Encounter for screening mammogram for malignant neoplasm of breast: Secondary | ICD-10-CM

## 2018-06-04 ENCOUNTER — Other Ambulatory Visit: Payer: Self-pay | Admitting: Internal Medicine

## 2018-06-04 DIAGNOSIS — M7661 Achilles tendinitis, right leg: Secondary | ICD-10-CM

## 2018-06-04 DIAGNOSIS — Z77018 Contact with and (suspected) exposure to other hazardous metals: Secondary | ICD-10-CM

## 2018-06-08 ENCOUNTER — Ambulatory Visit
Admission: RE | Admit: 2018-06-08 | Discharge: 2018-06-08 | Disposition: A | Discharge status: Home / Self Care | Payer: Medicare Other | Source: Ambulatory Visit | Attending: Internal Medicine | Admitting: Internal Medicine

## 2018-06-08 DIAGNOSIS — Z77018 Contact with and (suspected) exposure to other hazardous metals: Secondary | ICD-10-CM

## 2018-06-10 ENCOUNTER — Ambulatory Visit
Admission: RE | Admit: 2018-06-10 | Discharge: 2018-06-10 | Disposition: A | Discharge status: Home / Self Care | Payer: Medicare Other | Source: Ambulatory Visit | Attending: Internal Medicine | Admitting: Internal Medicine

## 2018-06-10 DIAGNOSIS — M7661 Achilles tendinitis, right leg: Secondary | ICD-10-CM

## 2018-06-13 ENCOUNTER — Telehealth: Payer: Self-pay | Admitting: *Deleted

## 2018-06-13 NOTE — Telephone Encounter (Signed)
Pt states she would like her records sent to [facility] to Dr. Dwaine Deter.

## 2018-06-18 ENCOUNTER — Ambulatory Visit: Payer: Medicare Other | Admitting: Podiatry

## 2018-06-18 ENCOUNTER — Encounter: Payer: Self-pay | Admitting: Podiatry

## 2018-06-18 DIAGNOSIS — M7661 Achilles tendinitis, right leg: Secondary | ICD-10-CM | POA: Diagnosis not present

## 2018-06-18 NOTE — Progress Notes (Signed)
   HPI: Patient presents today for follow-up treatment evaluation regarding Achilles tendinitis to the right lower extremity.  Patient has been actively engaged in physical therapy and rehabbing conservatively which she does get some significant relief from.  Last visit on 01/17/2018 MRI was ordered.  She presents today to review MRI results.   Past Medical History:  Diagnosis Date  . Arthritis   . Hypertension   . PONV (postoperative nausea and vomiting)    during catract surgery  . S/P catheter ablation of slow pathway 2000   for tachacardia  . Sinus infection    3 weeks ago      Physical Exam: General: The patient is alert and oriented x3 in no acute distress.  Dermatology: Skin is warm, dry and supple bilateral lower extremities. Negative for open lesions or macerations.  Vascular: Palpable pedal pulses bilaterally. No edema or erythema noted. Capillary refill within normal limits.  Neurological: Epicritic and protective threshold grossly intact bilaterally.   Musculoskeletal Exam: Hypertrophic area of the Achilles tendon noted which is hard and somewhat calcific with palpation approximately 4 cm proximal to the insertion of the calcaneus correlating with MRI.  Significant pain on palpation noted.  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 There is minimal edema superficial to the Achilles tendon at that site. No edema in Kager's fat pad. No retrocalcaneal bursitis.  Assessment: 1.  Calcific Achilles tendinitis/tendinosis right  Plan of Care:  1. Patient was evaluated.  2.  Today explained the patient that as long as conservative modalities are improving her symptoms we should continue conservative modalities.  If conservative modalities failed  to alleviate any of her symptoms or if the patient is unable to perform daily activities we should consider surgical intervention. 3.  Patient was made aware of a new treatment modality called [place], using ultrasonic modalities to alleviate chronic tendon pain and pathology.  This procedure was apparently performed by a Dr. Presley Raddle in [place], [place].  Recommend that the patient pursue a second opinion with this physician to see if it is a viable treatment modality for the patient's calcific tendon 4.  Return to clinic as needed or if she would like to proceed with surgical debridement of the Achilles tendon.  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 Going to [place], [place] for 5 days.     Edrick Kins, DPM [facility]  Dr. Edrick Kins, [address]                                        [place], [place] [address]                Office [phone]  Fax [phone]

## 2018-09-06 HISTORY — PX: ACHILLES TENDON SURGERY: SHX542

## 2019-04-10 ENCOUNTER — Other Ambulatory Visit: Payer: Self-pay | Admitting: Internal Medicine

## 2019-04-10 DIAGNOSIS — Z1231 Encounter for screening mammogram for malignant neoplasm of breast: Secondary | ICD-10-CM

## 2019-05-24 ENCOUNTER — Ambulatory Visit
Admission: RE | Admit: 2019-05-24 | Discharge: 2019-05-24 | Disposition: A | Discharge status: Home / Self Care | Payer: Medicare Other | Source: Ambulatory Visit | Attending: Internal Medicine | Admitting: Internal Medicine

## 2019-05-24 ENCOUNTER — Other Ambulatory Visit: Payer: Self-pay

## 2019-05-24 DIAGNOSIS — Z1231 Encounter for screening mammogram for malignant neoplasm of breast: Secondary | ICD-10-CM

## 2019-06-27 ENCOUNTER — Other Ambulatory Visit: Payer: Self-pay | Admitting: Internal Medicine

## 2019-06-27 DIAGNOSIS — E2839 Other primary ovarian failure: Secondary | ICD-10-CM

## 2019-09-26 ENCOUNTER — Other Ambulatory Visit: Payer: Medicare Other

## 2019-10-15 ENCOUNTER — Ambulatory Visit: Payer: Medicare Other

## 2019-10-24 ENCOUNTER — Ambulatory Visit: Payer: Medicare Other | Attending: Internal Medicine

## 2019-10-24 DIAGNOSIS — Z23 Encounter for immunization: Secondary | ICD-10-CM | POA: Insufficient documentation

## 2019-10-24 NOTE — Progress Notes (Signed)
   Covid-19 Vaccination Clinic  Name:  ARIANDA PILGREEN    MRN: [id number] DOB: [birth year]  10/24/2019  Ms. Pion was observed post Covid-19 immunization for 15 minutes without incidence. She was provided with Vaccine Information Sheet and instruction to access the V-Safe system.   Ms. Beacher was instructed to call 911 with any severe reactions post vaccine: Marland Kitchen Difficulty breathing  . Swelling of your face and throat  . A fast heartbeat  . A bad rash all over your body  . Dizziness and weakness    Immunizations Administered    Name Date Dose VIS Date Route   Pfizer COVID-19 Vaccine 10/24/2019  9:15 AM 0.3 mL 08/30/2019 Intramuscular   Manufacturer: [place]   Lot: [id number]   [place]: [id number]

## 2019-11-01 ENCOUNTER — Ambulatory Visit: Payer: Medicare Other

## 2019-11-18 ENCOUNTER — Ambulatory Visit: Payer: Medicare Other | Attending: Internal Medicine

## 2019-11-18 DIAGNOSIS — Z23 Encounter for immunization: Secondary | ICD-10-CM

## 2019-11-18 NOTE — Progress Notes (Signed)
   Covid-19 Vaccination Clinic  Name:  Laura Haas    MRN: [id number] DOB: 11-04-1945  11/18/2019  Laura Haas was observed post Covid-19 immunization for 15 minutes without incidence. She was provided with Vaccine Information Sheet and instruction to access the V-Safe system.   Laura Haas was instructed to call 911 with any severe reactions post vaccine: Marland Kitchen Difficulty breathing  . Swelling of your face and throat  . A fast heartbeat  . A bad rash all over your body  . Dizziness and weakness    Immunizations Administered    Name Date Dose VIS Date Route   Pfizer COVID-19 Vaccine 11/18/2019  2:04 PM 0.3 mL 08/30/2019 Intramuscular   Manufacturer: [place]   Lot: [id number]   [place]: [id number]

## 2019-12-25 ENCOUNTER — Other Ambulatory Visit: Payer: Self-pay

## 2019-12-25 ENCOUNTER — Ambulatory Visit
Admission: RE | Admit: 2019-12-25 | Discharge: 2019-12-25 | Disposition: A | Discharge status: Home / Self Care | Payer: Medicare Other | Source: Ambulatory Visit | Attending: Internal Medicine | Admitting: Internal Medicine

## 2019-12-25 DIAGNOSIS — E2839 Other primary ovarian failure: Secondary | ICD-10-CM

## 2020-04-27 ENCOUNTER — Other Ambulatory Visit: Payer: Self-pay | Admitting: Internal Medicine

## 2020-04-27 DIAGNOSIS — Z1231 Encounter for screening mammogram for malignant neoplasm of breast: Secondary | ICD-10-CM

## 2020-06-02 ENCOUNTER — Ambulatory Visit
Admission: RE | Admit: 2020-06-02 | Discharge: 2020-06-02 | Disposition: A | Discharge status: Home / Self Care | Payer: Medicare Other | Source: Ambulatory Visit | Attending: Internal Medicine | Admitting: Internal Medicine

## 2020-06-02 ENCOUNTER — Other Ambulatory Visit: Payer: Self-pay

## 2020-06-02 DIAGNOSIS — Z1231 Encounter for screening mammogram for malignant neoplasm of breast: Secondary | ICD-10-CM

## 2020-09-29 DIAGNOSIS — H02824 Cysts of left upper eyelid: Secondary | ICD-10-CM | POA: Diagnosis not present

## 2020-11-04 DIAGNOSIS — E782 Mixed hyperlipidemia: Secondary | ICD-10-CM | POA: Diagnosis not present

## 2020-11-04 DIAGNOSIS — G4700 Insomnia, unspecified: Secondary | ICD-10-CM | POA: Diagnosis not present

## 2020-11-04 DIAGNOSIS — E7800 Pure hypercholesterolemia, unspecified: Secondary | ICD-10-CM | POA: Diagnosis not present

## 2020-11-04 DIAGNOSIS — I10 Essential (primary) hypertension: Secondary | ICD-10-CM | POA: Diagnosis not present

## 2020-11-18 ENCOUNTER — Other Ambulatory Visit: Payer: Self-pay

## 2020-11-18 ENCOUNTER — Encounter (INDEPENDENT_AMBULATORY_CARE_PROVIDER_SITE_OTHER): Payer: Self-pay | Admitting: Family Medicine

## 2020-11-18 ENCOUNTER — Ambulatory Visit (INDEPENDENT_AMBULATORY_CARE_PROVIDER_SITE_OTHER): Payer: Medicare Other | Admitting: Family Medicine

## 2020-11-18 VITALS — BP 148/81 | HR 85 | Temp 98.4°F | Ht 63.0 in | Wt 215.0 lb

## 2020-11-18 DIAGNOSIS — R739 Hyperglycemia, unspecified: Secondary | ICD-10-CM

## 2020-11-18 DIAGNOSIS — R5383 Other fatigue: Secondary | ICD-10-CM

## 2020-11-18 DIAGNOSIS — Z6838 Body mass index (BMI) 38.0-38.9, adult: Secondary | ICD-10-CM

## 2020-11-18 DIAGNOSIS — I10 Essential (primary) hypertension: Secondary | ICD-10-CM | POA: Diagnosis not present

## 2020-11-18 DIAGNOSIS — M154 Erosive (osteo)arthritis: Secondary | ICD-10-CM | POA: Diagnosis not present

## 2020-11-18 DIAGNOSIS — R0602 Shortness of breath: Secondary | ICD-10-CM | POA: Diagnosis not present

## 2020-11-18 DIAGNOSIS — E559 Vitamin D deficiency, unspecified: Secondary | ICD-10-CM

## 2020-11-18 DIAGNOSIS — Z1331 Encounter for screening for depression: Secondary | ICD-10-CM

## 2020-11-18 DIAGNOSIS — Z0289 Encounter for other administrative examinations: Secondary | ICD-10-CM

## 2020-11-19 LAB — COMPREHENSIVE METABOLIC PANEL
ALT: 44 IU/L — ABNORMAL HIGH (ref 0–32)
AST: 31 IU/L (ref 0–40)
Albumin/Globulin Ratio: 1.8 (ref 1.2–2.2)
Albumin: 4.6 g/dL (ref 3.7–4.7)
Alkaline Phosphatase: 164 IU/L — ABNORMAL HIGH (ref 44–121)
BUN/Creatinine Ratio: 21 (ref 12–28)
BUN: 23 mg/dL (ref 8–27)
Bilirubin Total: 0.5 mg/dL (ref 0.0–1.2)
CO2: 21 mmol/L (ref 20–29)
Calcium: 9.4 mg/dL (ref 8.7–10.3)
Chloride: 101 mmol/L (ref 96–106)
Creatinine, Ser: 1.07 mg/dL — ABNORMAL HIGH (ref 0.57–1.00)
Globulin, Total: 2.5 g/dL (ref 1.5–4.5)
Glucose: 101 mg/dL — ABNORMAL HIGH (ref 65–99)
Potassium: 4.5 mmol/L (ref 3.5–5.2)
Sodium: 139 mmol/L (ref 134–144)
Total Protein: 7.1 g/dL (ref 6.0–8.5)
eGFR: 55 mL/min/{1.73_m2} — ABNORMAL LOW (ref >59)

## 2020-11-19 LAB — LIPID PANEL WITH LDL/HDL RATIO
Cholesterol, Total: 275 mg/dL — ABNORMAL HIGH (ref 100–199)
HDL: 88 mg/dL (ref >39)
LDL Chol Calc (NIH): 170 mg/dL — ABNORMAL HIGH (ref 0–99)
LDL/HDL Ratio: 1.9 ratio (ref 0.0–3.2)
Triglycerides: 102 mg/dL (ref 0–149)
VLDL Cholesterol Cal: 17 mg/dL (ref 5–40)

## 2020-11-19 LAB — T4: T4, Total: 8.4 ug/dL (ref 4.5–12.0)

## 2020-11-19 LAB — CBC WITH DIFFERENTIAL/PLATELET
Basophils Absolute: 0.1 10*3/uL (ref 0.0–0.2)
Basos: 1 %
EOS (ABSOLUTE): 0.1 10*3/uL (ref 0.0–0.4)
Eos: 2 %
Hematocrit: 42.6 % (ref 34.0–46.6)
Hemoglobin: 14.2 g/dL (ref 11.1–15.9)
Immature Grans (Abs): 0 10*3/uL (ref 0.0–0.1)
Immature Granulocytes: 0 %
Lymphocytes Absolute: 0.7 10*3/uL (ref 0.7–3.1)
Lymphs: 13 %
MCH: 30.8 pg (ref 26.6–33.0)
MCHC: 33.3 g/dL (ref 31.5–35.7)
MCV: 92 fL (ref 79–97)
Monocytes Absolute: 0.5 10*3/uL (ref 0.1–0.9)
Monocytes: 10 %
Neutrophils Absolute: 4.1 10*3/uL (ref 1.4–7.0)
Neutrophils: 74 %
Platelets: 211 10*3/uL (ref 150–450)
RBC: 4.61 x10E6/uL (ref 3.77–5.28)
RDW: 12.8 % (ref 11.7–15.4)
WBC: 5.6 10*3/uL (ref 3.4–10.8)

## 2020-11-19 LAB — T3: T3, Total: 115 ng/dL (ref 71–180)

## 2020-11-19 LAB — HEMOGLOBIN A1C
Est. average glucose Bld gHb Est-mCnc: 111 mg/dL
Hgb A1c MFr Bld: 5.5 % (ref 4.8–5.6)

## 2020-11-19 LAB — VITAMIN D 25 HYDROXY (VIT D DEFICIENCY, FRACTURES): Vit D, 25-Hydroxy: 35.7 ng/mL (ref 30.0–100.0)

## 2020-11-19 LAB — INSULIN, RANDOM: INSULIN: 9.8 u[IU]/mL (ref 2.6–24.9)

## 2020-11-19 LAB — TSH: TSH: 3.13 u[IU]/mL (ref 0.450–4.500)

## 2020-11-23 NOTE — Progress Notes (Signed)
Chief Complaint:   OBESITY Laura Haas (MR# [id number]) is a 75 y.o. female who presents for evaluation and treatment of obesity and related comorbidities. Current BMI is Body mass index is 38.09 kg/m. Laura Haas has been struggling with her weight for many years and has been unsuccessful in either losing weight, maintaining weight loss, or reaching her healthy weight goal.  Laura Haas is currently in the action stage of change and ready to dedicate time achieving and maintaining a healthier weight. Laura Haas is interested in becoming our patient and working on intensive lifestyle modifications including (but not limited to) diet and exercise for weight loss.  Laura Haas heard about the clinic from friends/patients. She desires a weight of 165 lbs. Breakfast- 1-2 eggs and fruit (berries or melons) about 1 cup (feel full); around 3 PM- apple; 5:30 PM- wine, cheese, and crackers (5-6 crackers, 4-5 slices cheese); Dinner- glass of wine, 3 oz salmon, 1 cup green beans; mostly not eating after dinner.    Laura Haas's habits were reviewed today and are as follows: Her family eats meals together, she thinks her family will eat healthier with her, her desired weight loss is 50 lbs, she started gaining weight at age 45, her heaviest weight ever was 220 pounds, she has significant food cravings issues, she skips meals frequently, she is frequently drinking liquids with calories, she frequently makes poor food choices and she struggles with emotional eating.  Depression Screen Laura Haas's Food and Mood (modified PHQ-9) score was 8.  Depression screen [facility] 2/9 11/18/2020  Decreased Interest 2  Down, Depressed, Hopeless 1  PHQ - 2 Score 3  Altered sleeping 1  Tired, decreased energy 2  Change in appetite 1  Feeling bad or failure about yourself  1  Trouble concentrating 0  Moving slowly or fidgety/restless 0  Suicidal thoughts 0  PHQ-9 Score 8  Difficult doing work/chores Not difficult at all   Subjective:   1.  Other fatigue Laura Haas admits to daytime somnolence and admits to waking up still tired. Patent has a history of symptoms of daytime fatigue. Laura Haas generally gets 8 hours of sleep per night, and states that she has generally restful sleep. Snoring is present. Apneic episodes are present. [name] is 5. EKG normal sinus rhythm at 82 bpm.  2. SOB (shortness of breath) on exertion Laura Haas notes increasing shortness of breath with exercising and seems to be worsening over time with weight gain. She notes getting out of breath sooner with activity than she used to. This has gotten worse recently. Laura Haas denies shortness of breath at rest or orthopnea. EKG normal sinus rhythm at 82 bpm.  3. Essential hypertension Laura Haas's BP is slightly elevated today.  She denies chest pain, chest pressure and headache. She is on telmisartan 80 mg.  BP Readings from Last 3 Encounters:  11/18/20 (!) 148/81  05/21/13 128/71    4. Hyperglycemia Laura Haas has an elevated BS at 104 fasting. She is not on medication.  5. Vitamin D deficiency Laura Haas denies nausea, vomiting, and muscle weakness but notes fatigue. Laura Haas is on OTC Vit D 1,000 IU.  6. Erosive osteoarthritis [name] is on hydroxychloroquine. She sees [name] at Rheumatology.  Assessment/Plan:   1. Other fatigue [name] does feel that her weight is causing her energy to be lower than it should be. Fatigue may be related to obesity, depression or many other causes. Labs will be ordered, and in the meanwhile, Laura Haas will focus on self care including making  healthy food choices, increasing physical activity and focusing on stress reduction. Check labs today.  - EKG 12-Lead - CBC with Differential/Platelet - T3 - T4 - TSH  2. SOB (shortness of breath) on exertion Laura Haas does feel that she gets out of breath more easily that she used to when she exercises. Laura Haas's shortness of breath appears to be obesity related and exercise induced. She has  agreed to work on weight loss and gradually increase exercise to treat her exercise induced shortness of breath. Will continue to monitor closely.  3. Essential hypertension Laura Haas is working on healthy weight loss and exercise to improve blood pressure control. We will watch for signs of hypotension as she continues her lifestyle modifications. Check labs today.  - Comprehensive metabolic panel - Lipid Panel With LDL/HDL Ratio  4. Hyperglycemia Fasting labs will be obtained and results with be discussed with [name] in 2 weeks at her follow up visit. In the meanwhile Laura Haas was started on a lower simple carbohydrate diet and will work on weight loss efforts. Check labs today.  - Hemoglobin A1c - Insulin, random  5. Vitamin D deficiency Low Vitamin D level contributes to fatigue and are associated with obesity, breast, and colon cancer. She agrees to continue to take OTC Vitamin D @1 ,000 IU every week and will follow-up for routine testing of Vitamin D, at least 2-3 times per year to avoid over-replacement. Check labs today.  - VITAMIN D 25 Hydroxy (Vit-D Deficiency, Fractures)  6. Erosive osteoarthritis Follow up with Dr. Lenna Gilford next month.  7. Depression screening Laura Haas had a positive depression screening. Depression is commonly associated with obesity and often results in emotional eating behaviors. We will monitor this closely and work on CBT to help improve the non-hunger eating patterns. Referral to Psychology may be required if no improvement is seen as she continues in our clinic.  8. Class 2 severe obesity with serious comorbidity and body mass index (BMI) of 38.0 to 38.9 in adult, unspecified obesity type [name]) Laura Haas is currently in the action stage of change and her goal is to continue with weight loss efforts. I recommend Laura Haas begin the structured treatment plan as follows:  She has agreed to the Category 2 Plan + 100 calories.  Exercise goals: No exercise has been  prescribed at this time.   Behavioral modification strategies: increasing lean protein intake, meal planning and cooking strategies, keeping healthy foods in the home and planning for success.  She was informed of the importance of frequent follow-up visits to maximize her success with intensive lifestyle modifications for her multiple health conditions. She was informed we would discuss her lab results at her next visit unless there is a critical issue that needs to be addressed sooner. Leylanie agreed to keep her next visit at the agreed upon time to discuss these results.  Objective:   Blood pressure (!) 148/81, pulse 85, temperature 98.4 F (36.9 C), temperature source Oral, height 5\' 3"  (1.6 m), weight 215 lb (97.5 kg), SpO2 97 %. Body mass index is 38.09 kg/m.  EKG: Normal sinus rhythm, rate 82.  Indirect Calorimeter completed today shows a VO2 of 216 and a REE of 1501.  Her calculated basal metabolic rate is 1610 thus her basal metabolic rate is worse than expected.  General: Cooperative, alert, well developed, in no acute distress. HEENT: Conjunctivae and lids unremarkable. Cardiovascular: Regular rhythm.  Lungs: Normal work of breathing. Neurologic: No focal deficits.   Lab Results  Component Value Date  CREATININE 1.07 (H) 11/18/2020   BUN 23 11/18/2020   NA 139 11/18/2020   K 4.5 11/18/2020   CL 101 11/18/2020   CO2 21 11/18/2020   Lab Results  Component Value Date   ALT 44 (H) 11/18/2020   AST 31 11/18/2020   ALKPHOS 164 (H) 11/18/2020   BILITOT 0.5 11/18/2020   Lab Results  Component Value Date   HGBA1C 5.5 11/18/2020   Lab Results  Component Value Date   INSULIN 9.8 11/18/2020   Lab Results  Component Value Date   TSH 3.130 11/18/2020   Lab Results  Component Value Date   CHOL 275 (H) 11/18/2020   HDL 88 11/18/2020   LDLCALC 170 (H) 11/18/2020   TRIG 102 11/18/2020   Lab Results  Component Value Date   WBC 5.6 11/18/2020   HGB 14.2 11/18/2020    HCT 42.6 11/18/2020   MCV 92 11/18/2020   PLT 211 11/18/2020   No results found for: IRON, TIBC, FERRITIN Obesity Behavioral Intervention:   Approximately 15 minutes were spent on the discussion below.  ASK: We discussed the diagnosis of obesity with [name] today and Rose-Marie agreed to give [place] permission to discuss obesity behavioral modification therapy today.  ASSESS: Seraphine has the diagnosis of obesity and her BMI today is 38.2. Norina is in the action stage of change.   ADVISE: Mazikeen was educated on the multiple health risks of obesity as well as the benefit of weight loss to improve her health. She was advised of the need for long term treatment and the importance of lifestyle modifications to improve her current health and to decrease her risk of future health problems.  AGREE: Multiple dietary modification options and treatment options were discussed and Arthur agreed to follow the recommendations documented in the above note.  ARRANGE: Samyah was educated on the importance of frequent visits to treat obesity as outlined per CMS and USPSTF guidelines and agreed to schedule her next follow up appointment today.  Attestation Statements:   Reviewed by clinician on day of visit: allergies, medications, problem list, medical history, surgical history, family history, social history, and previous encounter notes.  This is the patient's first visit at [facility]. The patient's NEW PATIENT PACKET was reviewed at length. Included in the packet: current and past health history, medications, allergies, ROS, gynecologic history (women only), surgical history, family history, social history, weight history, weight loss surgery history (for those that have had weight loss surgery), nutritional evaluation, mood and food questionnaire, PHQ9, Epworth questionnaire, sleep habits questionnaire, patient life and health improvement goals questionnaire. These will all be scanned into  the patient's chart under media.   During the visit, I independently reviewed the patient's EKG, bioimpedance scale results, and indirect calorimeter results. I used this information to tailor a meal plan for the patient that will help her to lose weight and will improve her obesity-related conditions going forward. I performed a medically necessary appropriate examination and/or evaluation. I discussed the assessment and treatment plan with the patient. The patient was provided an opportunity to ask questions and all were answered. The patient agreed with the plan and demonstrated an understanding of the instructions. Labs were ordered at this visit and will be reviewed at the next visit unless more critical results need to be addressed immediately. Clinical information was updated and documented in the EMR.   Time spent on visit including pre-visit chart review and post-visit care was 45 minutes.   A separate 15  minutes was spent on risk counseling (see above).    Coral Ceo, am acting as transcriptionist for [name], MD.   I have reviewed the above documentation for accuracy and completeness, and I agree with the above. - Jinny Blossom, MD

## 2020-12-01 DIAGNOSIS — E7800 Pure hypercholesterolemia, unspecified: Secondary | ICD-10-CM | POA: Diagnosis not present

## 2020-12-01 DIAGNOSIS — I10 Essential (primary) hypertension: Secondary | ICD-10-CM | POA: Diagnosis not present

## 2020-12-01 DIAGNOSIS — E782 Mixed hyperlipidemia: Secondary | ICD-10-CM | POA: Diagnosis not present

## 2020-12-01 DIAGNOSIS — G4700 Insomnia, unspecified: Secondary | ICD-10-CM | POA: Diagnosis not present

## 2020-12-02 ENCOUNTER — Ambulatory Visit (INDEPENDENT_AMBULATORY_CARE_PROVIDER_SITE_OTHER): Payer: Medicare Other | Admitting: Family Medicine

## 2020-12-02 ENCOUNTER — Encounter (INDEPENDENT_AMBULATORY_CARE_PROVIDER_SITE_OTHER): Payer: Self-pay | Admitting: Family Medicine

## 2020-12-02 ENCOUNTER — Other Ambulatory Visit: Payer: Self-pay

## 2020-12-02 VITALS — BP 126/69 | HR 77 | Temp 98.1°F | Ht 63.0 in | Wt 214.0 lb

## 2020-12-02 DIAGNOSIS — E559 Vitamin D deficiency, unspecified: Secondary | ICD-10-CM | POA: Diagnosis not present

## 2020-12-02 DIAGNOSIS — R7401 Elevation of levels of liver transaminase levels: Secondary | ICD-10-CM

## 2020-12-02 DIAGNOSIS — Z6838 Body mass index (BMI) 38.0-38.9, adult: Secondary | ICD-10-CM

## 2020-12-02 DIAGNOSIS — E8881 Metabolic syndrome: Secondary | ICD-10-CM

## 2020-12-02 DIAGNOSIS — I10 Essential (primary) hypertension: Secondary | ICD-10-CM | POA: Diagnosis not present

## 2020-12-02 DIAGNOSIS — E782 Mixed hyperlipidemia: Secondary | ICD-10-CM

## 2020-12-02 MED ORDER — VITAMIN D (ERGOCALCIFEROL) 1.25 MG (50000 UNIT) PO CAPS: 50000.0000 [IU] | ORAL_CAPSULE | ORAL | 0 refills | Status: DC

## 2020-12-07 NOTE — Progress Notes (Signed)
Chief Complaint:   OBESITY Laura Haas is here to discuss her progress with her obesity treatment plan along with follow-up of her obesity related diagnoses. Laura Haas is on the Category 2 Plan + 100 calories and states she is following her eating plan approximately 80% of the time. Laura Haas states she is doing water aerobics and walking 5,000 steps 60 minutes 2-5 times per week.  Today's visit was #: 2 Starting weight: 215 lbs Starting date: 11/18/2020 Today's weight: 214 lbs Today's date: 12/02/2020 Total lbs lost to date: 1 lb Total lbs lost since last in-office visit: 1 lb  Interim History: Laura Haas did some emotional eating due to her dog dying over the last few weeks. She did do celery and peanut butter, wine, crackers for snack calories. She did egg option at breakfast, and if she did yogurt, she added fruit. She did a sandwich originally but felt like she gained almost 5 lbs. Dinner- pt did salad and protein. She hasn't weighed her protein, just tried to limit meat portion to the size of her fist.  Subjective:   1. Transaminitis Pt's Alk Phos 164, AST 31, and ALT 44. She has no prior labs or ultrasound.  2. Mixed hyperlipidemia Pt's HDL 88, triglycerides 102, and LDL 170. Her 10 year ASCVD score is 17.1%. Laura Haas is unable to tolerate statins due to myalgias.  Lab Results  Component Value Date   CHOL 275 (H) 11/18/2020   HDL 88 11/18/2020   LDLCALC 170 (H) 11/18/2020   TRIG 102 11/18/2020   Lab Results  Component Value Date   ALT 44 (H) 11/18/2020   AST 31 11/18/2020   ALKPHOS 164 (H) 11/18/2020   BILITOT 0.5 11/18/2020   The 10-year ASCVD risk score Laura Bussing DC Jr., Laura al., 2013) is: 17.1%   Values used to calculate the score:     Age: 75 years     Sex: Female     Is Non-Hispanic African American: No     Diabetic: No     Tobacco smoker: No     Systolic Blood Pressure: 836 mmHg     Is BP treated: Yes     HDL Cholesterol: 88 mg/dL     Total Cholesterol: 275 mg/dL  3.  Vitamin D deficiency Pt denies nausea, vomiting, and muscle weakness but notes fatigue. Pt is on OTC Vit D 1,000 units daily..  4. Essential hypertension Arnette's BP is well controlled today. She is on Micardis 80 mg daily.  Pt denies chest pain, chest pressure and headache.  BP Readings from Last 3 Encounters:  12/02/20 126/69  11/18/20 (!) 148/81  05/21/13 128/71    5. Insulin resistance Laura Haas's A1c is 5.5 with an insulin level of 9.8. She has some drive for carbs occasionally.   Lab Results  Component Value Date   INSULIN 9.8 11/18/2020   Lab Results  Component Value Date   HGBA1C 5.5 11/18/2020    Assessment/Plan:   1. Transaminitis Follow up LFT's in 3 months.  2. Mixed hyperlipidemia Cardiovascular risk and specific lipid/LDL goals reviewed.  We discussed several lifestyle modifications today and Laura Haas will continue to work on diet, exercise and weight loss efforts. Orders and follow up as documented in patient record. Repeat labs in 3 months.  Counseling Intensive lifestyle modifications are the first line treatment for this issue. . Dietary changes: Increase soluble fiber. Decrease simple carbohydrates. . Exercise changes: Moderate to vigorous-intensity aerobic activity 150 minutes per week if tolerated. . Lipid-lowering medications: see  documented in medical record.  3. Vitamin D deficiency Low Vitamin D level contributes to fatigue and are associated with obesity, breast, and colon cancer. She agrees to start to take prescription Vitamin D _0 ,000 IU every week and will follow-up for routine testing of Vitamin D, at least 2-3 times per year to avoid over-replacement. Stop OTC Vit D- can restart once level is at goal.   - Vitamin D, Ergocalciferol, (DRISDOL) 1.25 MG (50000 UNIT) CAPS capsule; Take 1 capsule (50,000 Units total) by mouth every 7 (seven) days.  Dispense: 4 capsule; Refill: 0  4. Essential hypertension Laura Haas is working on healthy weight loss and  exercise to improve blood pressure control. We will watch for signs of hypotension as she continues her lifestyle modifications. Continue Micardis with no change in dose.  5. Insulin resistance Laura Haas will continue to work on weight loss, exercise, and decreasing simple carbohydrates to help decrease the risk of diabetes. Laura Haas agreed to follow-up with [place] as directed to closely monitor her progress. Repeat labs in 3 months.  6. Class 2 severe obesity with serious comorbidity and body mass index (BMI) of 38.0 to 38.9 in adult, unspecified obesity type [facility]) Laura Haas is currently in the action stage of change. As such, her goal is to continue with weight loss efforts. She has agreed to the Category 2 Plan + 100 calories.   Exercise goals: All adults should avoid inactivity. Some physical activity is better than none, and adults who participate in any amount of physical activity gain some health benefits.  Behavioral modification strategies: increasing lean protein intake, no skipping meals and meal planning and cooking strategies.  Laura Haas has agreed to follow-up with our clinic in 2 weeks. She was informed of the importance of frequent follow-up visits to maximize her success with intensive lifestyle modifications for her multiple health conditions.   Objective:   Blood pressure 126/69, pulse 77, temperature 98.1 F (36.7 C), temperature source Oral, height _1  (1.6 m), weight 214 lb (97.1 kg), SpO2 99 %. Body mass index is 37.91 kg/m.  General: Cooperative, alert, well developed, in no acute distress. HEENT: Conjunctivae and lids unremarkable. Cardiovascular: Regular rhythm.  Lungs: Normal work of breathing. Neurologic: No focal deficits.   Lab Results  Component Value Date   CREATININE 1.07 (H) 11/18/2020   BUN 23 11/18/2020   NA 139 11/18/2020   K 4.5 11/18/2020   CL 101 11/18/2020   CO2 21 11/18/2020   Lab Results  Component Value Date   ALT 44 (H) 11/18/2020   AST 31  11/18/2020   ALKPHOS 164 (H) 11/18/2020   BILITOT 0.5 11/18/2020   Lab Results  Component Value Date   HGBA1C 5.5 11/18/2020   Lab Results  Component Value Date   INSULIN 9.8 11/18/2020   Lab Results  Component Value Date   TSH 3.130 11/18/2020   Lab Results  Component Value Date   CHOL 275 (H) 11/18/2020   HDL 88 11/18/2020   LDLCALC 170 (H) 11/18/2020   TRIG 102 11/18/2020   Lab Results  Component Value Date   WBC 5.6 11/18/2020   HGB 14.2 11/18/2020   HCT 42.6 11/18/2020   MCV 92 11/18/2020   PLT 211 11/18/2020   No results found for: IRON, TIBC, FERRITIN  Obesity Behavioral Intervention:   Approximately 15 minutes were spent on the discussion below.  ASK: We discussed the diagnosis of obesity with Laura Haas today and Laura Haas agreed to give [place] permission to discuss obesity behavioral modification  therapy today.  ASSESS: Laura Haas has the diagnosis of obesity and her BMI today is 38.0. Ciria is in the action stage of change.   ADVISE: Laura Haas was educated on the multiple health risks of obesity as well as the benefit of weight loss to improve her health. She was advised of the need for long term treatment and the importance of lifestyle modifications to improve her current health and to decrease her risk of future health problems.  AGREE: Multiple dietary modification options and treatment options were discussed and Anakaren agreed to follow the recommendations documented in the above note.  ARRANGE: Artisha was educated on the importance of frequent visits to treat obesity as outlined per CMS and USPSTF guidelines and agreed to schedule her next follow up appointment today.  Attestation Statements:   Reviewed by clinician on day of visit: allergies, medications, problem list, medical history, surgical history, family history, social history, and previous encounter notes.  Coral Ceo, am acting as transcriptionist for [name], MD.   I have reviewed  the above documentation for accuracy and completeness, and I agree with the above. - Jinny Blossom, MD

## 2020-12-21 ENCOUNTER — Encounter (INDEPENDENT_AMBULATORY_CARE_PROVIDER_SITE_OTHER): Payer: Self-pay | Admitting: Family Medicine

## 2020-12-21 ENCOUNTER — Ambulatory Visit (INDEPENDENT_AMBULATORY_CARE_PROVIDER_SITE_OTHER): Payer: Medicare Other | Admitting: Family Medicine

## 2020-12-21 ENCOUNTER — Other Ambulatory Visit: Payer: Self-pay

## 2020-12-21 VITALS — BP 120/74 | HR 72 | Temp 98.3°F | Ht 63.0 in | Wt 211.0 lb

## 2020-12-21 DIAGNOSIS — Z6838 Body mass index (BMI) 38.0-38.9, adult: Secondary | ICD-10-CM

## 2020-12-21 DIAGNOSIS — I10 Essential (primary) hypertension: Secondary | ICD-10-CM

## 2020-12-21 DIAGNOSIS — E559 Vitamin D deficiency, unspecified: Secondary | ICD-10-CM

## 2020-12-22 DIAGNOSIS — M154 Erosive (osteo)arthritis: Secondary | ICD-10-CM | POA: Diagnosis not present

## 2020-12-22 DIAGNOSIS — Z79899 Other long term (current) drug therapy: Secondary | ICD-10-CM | POA: Diagnosis not present

## 2020-12-22 DIAGNOSIS — M150 Primary generalized (osteo)arthritis: Secondary | ICD-10-CM | POA: Diagnosis not present

## 2020-12-22 DIAGNOSIS — M2550 Pain in unspecified joint: Secondary | ICD-10-CM | POA: Diagnosis not present

## 2020-12-22 DIAGNOSIS — E790 Hyperuricemia without signs of inflammatory arthritis and tophaceous disease: Secondary | ICD-10-CM | POA: Diagnosis not present

## 2020-12-26 NOTE — Progress Notes (Signed)
Chief Complaint:   OBESITY Laura Haas is here to discuss her progress with her obesity treatment plan along with follow-up of her obesity related diagnoses. Laura Haas is on the Category 2 Plan + 100 calories and states she is following her eating plan approximately 85% of the time. Laura Haas states she is walking and water aerobics 60 minutes 2 times per week.  Today's visit was #: 3 Starting weight: 215 lbs Starting date: 11/18/2020 Today's weight: 211 lbs Today's date: 12/21/2020 Total lbs lost to date: 4 Total lbs lost since last in-office visit: 3  Interim History: The last few weeks, pt followed the plan easily. She does feel like she is eating too much bread. She is going to [place] for 5 days, then having a [place] cruise for 5 days. She is staying in a house in [place]. She is doing a Careers adviser. Pt is getting ~7-8 oz at dinner. For snacks, she is doing wine.  Subjective:   1. Essential hypertension Laura Haas's BP is well controlled today.  Pt denies chest pain, chest pressure and headache.  2. Vitamin D deficiency Laura Haas denies nausea, vomiting, and muscle weakness but notes fatigue. Pt is on prescription Vit D.  Assessment/Plan:   1. Essential hypertension Laura Haas is working on healthy weight loss and exercise to improve blood pressure control. We will watch for signs of hypotension as she continues her lifestyle modifications. Follow up BP at next appointment. If BP is well controlled, can decrease Micardis.  2. Vitamin D deficiency Low Vitamin D level contributes to fatigue and are associated with obesity, breast, and colon cancer. She agrees to continue to take prescription Vitamin D @50 ,000 IU every week and will follow-up for routine testing of Vitamin D, at least 2-3 times per year to avoid over-replacement.  3. Class 2 severe obesity with serious comorbidity and body mass index (BMI) of 38.0 to 38.9 in adult, unspecified obesity type [facility]) Laura Haas is currently in  the action stage of change. As such, her goal is to continue with weight loss efforts. She has agreed to the Category 2 Plan + 100 calories.   Exercise goals: As is  Behavioral modification strategies: increasing lean protein intake, meal planning and cooking strategies, keeping healthy foods in the home and travel eating strategies.  Laura Haas has agreed to follow-up with our clinic in 2-3 weeks. She was informed of the importance of frequent follow-up visits to maximize her success with intensive lifestyle modifications for her multiple health conditions.   Objective:   Blood pressure 120/74, pulse 72, temperature 98.3 F (36.8 C), height 5\' 3"  (1.6 m), weight 211 lb (95.7 kg), SpO2 97 %. Body mass index is 37.38 kg/m.  General: Cooperative, alert, well developed, in no acute distress. HEENT: Conjunctivae and lids unremarkable. Cardiovascular: Regular rhythm.  Lungs: Normal work of breathing. Neurologic: No focal deficits.   Lab Results  Component Value Date   CREATININE 1.07 (H) 11/18/2020   BUN 23 11/18/2020   NA 139 11/18/2020   K 4.5 11/18/2020   CL 101 11/18/2020   CO2 21 11/18/2020   Lab Results  Component Value Date   ALT 44 (H) 11/18/2020   AST 31 11/18/2020   ALKPHOS 164 (H) 11/18/2020   BILITOT 0.5 11/18/2020   Lab Results  Component Value Date   HGBA1C 5.5 11/18/2020   Lab Results  Component Value Date   INSULIN 9.8 11/18/2020   Lab Results  Component Value Date   TSH 3.130 11/18/2020  Lab Results  Component Value Date   CHOL 275 (H) 11/18/2020   HDL 88 11/18/2020   LDLCALC 170 (H) 11/18/2020   TRIG 102 11/18/2020   Lab Results  Component Value Date   WBC 5.6 11/18/2020   HGB 14.2 11/18/2020   HCT 42.6 11/18/2020   MCV 92 11/18/2020   PLT 211 11/18/2020    Attestation Statements:   Reviewed by clinician on day of visit: allergies, medications, problem list, medical history, surgical history, family history, social history, and previous  encounter notes.  Time spent on visit including pre-visit chart review and post-visit care and charting was 15 minutes.   Coral Ceo, am acting as transcriptionist for [name], MD.   I have reviewed the above documentation for accuracy and completeness, and I agree with the above. - Jinny Blossom, MD

## 2020-12-28 ENCOUNTER — Other Ambulatory Visit (INDEPENDENT_AMBULATORY_CARE_PROVIDER_SITE_OTHER): Payer: Self-pay | Admitting: Family Medicine

## 2020-12-28 DIAGNOSIS — E559 Vitamin D deficiency, unspecified: Secondary | ICD-10-CM

## 2020-12-31 ENCOUNTER — Encounter (INDEPENDENT_AMBULATORY_CARE_PROVIDER_SITE_OTHER): Payer: Self-pay

## 2021-01-11 ENCOUNTER — Ambulatory Visit (INDEPENDENT_AMBULATORY_CARE_PROVIDER_SITE_OTHER): Payer: Medicare Other | Admitting: Family Medicine

## 2021-02-01 ENCOUNTER — Ambulatory Visit (INDEPENDENT_AMBULATORY_CARE_PROVIDER_SITE_OTHER): Payer: Medicare Other | Admitting: Family Medicine

## 2021-02-03 ENCOUNTER — Other Ambulatory Visit: Payer: Self-pay | Admitting: Gastroenterology

## 2021-02-03 DIAGNOSIS — R7989 Other specified abnormal findings of blood chemistry: Secondary | ICD-10-CM

## 2021-02-03 DIAGNOSIS — R194 Change in bowel habit: Secondary | ICD-10-CM | POA: Diagnosis not present

## 2021-02-03 DIAGNOSIS — R945 Abnormal results of liver function studies: Secondary | ICD-10-CM | POA: Diagnosis not present

## 2021-02-03 DIAGNOSIS — K5790 Diverticulosis of intestine, part unspecified, without perforation or abscess without bleeding: Secondary | ICD-10-CM | POA: Diagnosis not present

## 2021-02-08 DIAGNOSIS — G4700 Insomnia, unspecified: Secondary | ICD-10-CM | POA: Diagnosis not present

## 2021-02-08 DIAGNOSIS — E782 Mixed hyperlipidemia: Secondary | ICD-10-CM | POA: Diagnosis not present

## 2021-02-08 DIAGNOSIS — I10 Essential (primary) hypertension: Secondary | ICD-10-CM | POA: Diagnosis not present

## 2021-02-08 DIAGNOSIS — E7800 Pure hypercholesterolemia, unspecified: Secondary | ICD-10-CM | POA: Diagnosis not present

## 2021-02-09 ENCOUNTER — Ambulatory Visit (INDEPENDENT_AMBULATORY_CARE_PROVIDER_SITE_OTHER): Payer: Medicare Other | Admitting: Family Medicine

## 2021-02-09 ENCOUNTER — Other Ambulatory Visit: Payer: Self-pay

## 2021-02-09 ENCOUNTER — Encounter (INDEPENDENT_AMBULATORY_CARE_PROVIDER_SITE_OTHER): Payer: Self-pay | Admitting: Family Medicine

## 2021-02-09 VITALS — BP 120/77 | HR 79 | Temp 97.6°F | Ht 63.0 in | Wt 208.0 lb

## 2021-02-09 DIAGNOSIS — I10 Essential (primary) hypertension: Secondary | ICD-10-CM

## 2021-02-09 DIAGNOSIS — E559 Vitamin D deficiency, unspecified: Secondary | ICD-10-CM

## 2021-02-09 DIAGNOSIS — Z6838 Body mass index (BMI) 38.0-38.9, adult: Secondary | ICD-10-CM

## 2021-02-09 MED ORDER — VITAMIN D (ERGOCALCIFEROL) 1.25 MG (50000 UNIT) PO CAPS: 50000.0000 [IU] | ORAL_CAPSULE | ORAL | 0 refills | Status: DC

## 2021-02-10 NOTE — Progress Notes (Signed)
Chief Complaint:   OBESITY Laura Haas is here to discuss her progress with her obesity treatment plan along with follow-up of her obesity related diagnoses. Laura Haas is on the Category 2 Plan + 100 calories and states she is following her eating plan approximately 90% of the time. Laura Haas states she is walking and doing water aerobics 30-60 minutes 4-7 times per week.  Today's visit was #: 4 Starting weight: 215 lbs Starting date: 11/18/2020 Today's weight: 208 lbs Today's date: 02/09/2021 Total lbs lost to date: 7 Total lbs lost since last in-office visit: 3  Interim History: Laura Haas went on a cruise to [place] with her granddaughter and her friend and really enjoyed her travel. She got back 01/29/2021 but she stayed in [place] for a bit afterwards. She leaves June 3 from [place] from [place] to [place]. She tried to be mindful of food choices while on ship.  Subjective:   1. Essential hypertension BP controlled today. Laura Haas is on Micardis 80 mg. Pt denies chest pain/chest pressure/headache. Her BP was elevated at gastroenterology recently.  2. Vitamin D deficiency Laura Haas denies nausea, vomiting, and muscle weakness but notes fatigue. Pt is on prescription Vit D. Her last Vit D level was 35.7.  Assessment/Plan:   1. Essential hypertension Laura Haas is working on healthy weight loss and exercise to improve blood pressure control. We will watch for signs of hypotension as she continues her lifestyle modifications. -Continue current treatment plan- follow up BP at next appt.  2. Vitamin D deficiency Low Vitamin D level contributes to fatigue and are associated with obesity, breast, and colon cancer. She agrees to continue to take prescription Vitamin D @50 ,000 IU every week and will follow-up for routine testing of Vitamin D, at least 2-3 times per year to avoid over-replacement. - Vitamin D, Ergocalciferol, (DRISDOL) 1.25 MG (50000 UNIT) CAPS capsule; Take 1 capsule (50,000  Units total) by mouth every 7 (seven) days.  Dispense: 8 capsule; Refill: 0  3. Class 2 severe obesity with serious comorbidity and body mass index (BMI) of 38.0 to 38.9 in adult, unspecified obesity type Laura Haas) Laura Haas is currently in the action stage of change. As such, her goal is to continue with weight loss efforts. She has agreed to the Category 2 Plan +100 calories.   Exercise goals: As is  Behavioral modification strategies: increasing lean protein intake, meal planning and cooking strategies and keeping healthy foods in the home.  Laura Haas has agreed to follow-up with our clinic in 4 weeks. She was informed of the importance of frequent follow-up visits to maximize her success with intensive lifestyle modifications for her multiple health conditions.   Objective:   Blood pressure 120/77, pulse 79, temperature 97.6 F (36.4 C), height 5\' 3"  (1.6 m), weight 208 lb (94.3 kg), SpO2 97 %. Body mass index is 36.85 kg/m.  General: Cooperative, alert, well developed, in no acute distress. HEENT: Conjunctivae and lids unremarkable. Cardiovascular: Regular rhythm.  Lungs: Normal work of breathing. Neurologic: No focal deficits.   Lab Results  Component Value Date   CREATININE 1.07 (H) 11/18/2020   BUN 23 11/18/2020   NA 139 11/18/2020   K 4.5 11/18/2020   CL 101 11/18/2020   CO2 21 11/18/2020   Lab Results  Component Value Date   ALT 44 (H) 11/18/2020   AST 31 11/18/2020   ALKPHOS 164 (H) 11/18/2020   BILITOT 0.5 11/18/2020   Lab Results  Component Value Date   HGBA1C 5.5 11/18/2020  Lab Results  Component Value Date   INSULIN 9.8 11/18/2020   Lab Results  Component Value Date   TSH 3.130 11/18/2020   Lab Results  Component Value Date   CHOL 275 (H) 11/18/2020   HDL 88 11/18/2020   LDLCALC 170 (H) 11/18/2020   TRIG 102 11/18/2020   Lab Results  Component Value Date   WBC 5.6 11/18/2020   HGB 14.2 11/18/2020   HCT 42.6 11/18/2020   MCV 92 11/18/2020   PLT  211 11/18/2020   No results found for: IRON, TIBC, FERRITIN  Obesity Behavioral Intervention:   Approximately 15 minutes were spent on the discussion below.  ASK: We discussed the diagnosis of obesity with Laura Haas today and Laura Haas agreed to give [place] permission to discuss obesity behavioral modification therapy today.  ASSESS: Laura Haas has the diagnosis of obesity and her BMI today is 36.8. Laura Haas is in the action stage of change.   ADVISE: Laura Haas was educated on the multiple health risks of obesity as well as the benefit of weight loss to improve her health. She was advised of the need for long term treatment and the importance of lifestyle modifications to improve her current health and to decrease her risk of future health problems.  AGREE: Multiple dietary modification options and treatment options were discussed and Laura Haas agreed to follow the recommendations documented in the above note.  ARRANGE: Laura Haas was educated on the importance of frequent visits to treat obesity as outlined per CMS and USPSTF guidelines and agreed to schedule her next follow up appointment today.  Attestation Statements:   Reviewed by clinician on day of visit: allergies, medications, problem list, medical history, surgical history, family history, social history, and previous encounter notes.  Coral Ceo, CMA, am acting as transcriptionist for [name], MD.   I have reviewed the above documentation for accuracy and completeness, and I agree with the above. - Jinny Blossom, MD

## 2021-02-17 DIAGNOSIS — Z20822 Contact with and (suspected) exposure to covid-19: Secondary | ICD-10-CM | POA: Diagnosis not present

## 2021-03-11 DIAGNOSIS — E782 Mixed hyperlipidemia: Secondary | ICD-10-CM | POA: Diagnosis not present

## 2021-03-11 DIAGNOSIS — G4700 Insomnia, unspecified: Secondary | ICD-10-CM | POA: Diagnosis not present

## 2021-03-11 DIAGNOSIS — I10 Essential (primary) hypertension: Secondary | ICD-10-CM | POA: Diagnosis not present

## 2021-03-11 DIAGNOSIS — E7800 Pure hypercholesterolemia, unspecified: Secondary | ICD-10-CM | POA: Diagnosis not present

## 2021-03-15 ENCOUNTER — Ambulatory Visit
Admission: RE | Admit: 2021-03-15 | Discharge: 2021-03-15 | Disposition: A | Discharge status: Home / Self Care | Payer: Medicare Other | Source: Ambulatory Visit | Attending: Gastroenterology | Admitting: Gastroenterology

## 2021-03-15 DIAGNOSIS — K760 Fatty (change of) liver, not elsewhere classified: Secondary | ICD-10-CM | POA: Diagnosis not present

## 2021-03-15 DIAGNOSIS — R7989 Other specified abnormal findings of blood chemistry: Secondary | ICD-10-CM

## 2021-03-17 ENCOUNTER — Other Ambulatory Visit: Payer: Self-pay

## 2021-03-17 ENCOUNTER — Encounter (INDEPENDENT_AMBULATORY_CARE_PROVIDER_SITE_OTHER): Payer: Self-pay | Admitting: Family Medicine

## 2021-03-17 ENCOUNTER — Ambulatory Visit (INDEPENDENT_AMBULATORY_CARE_PROVIDER_SITE_OTHER): Payer: Medicare Other | Admitting: Family Medicine

## 2021-03-17 VITALS — BP 121/75 | HR 74 | Temp 97.9°F | Ht 63.0 in | Wt 208.0 lb

## 2021-03-17 DIAGNOSIS — I10 Essential (primary) hypertension: Secondary | ICD-10-CM

## 2021-03-17 DIAGNOSIS — E559 Vitamin D deficiency, unspecified: Secondary | ICD-10-CM

## 2021-03-17 DIAGNOSIS — Z6838 Body mass index (BMI) 38.0-38.9, adult: Secondary | ICD-10-CM

## 2021-03-24 NOTE — Progress Notes (Signed)
Chief Complaint:   OBESITY Laura Haas is here to discuss her progress with her obesity treatment plan along with follow-up of her obesity related diagnoses. Laura Haas is on the Category 2 Plan + 100 calories and states she is following her eating plan approximately 10% of the time. Laura Haas states she is walking and aerobics 60 minutes 2 times per week.  Today's visit was #: 5 Starting weight: 215 lbs Starting date: 11/18/2020 Today's weight: 208 lbs Today's date: 03/17/2021 Total lbs lost to date: 7 Total lbs lost since last in-office visit: 0  Interim History: Laura Haas got back from her trip last week and has been trying to get back on plan. (Recent [place] cruise). She is going to a picnic for the 4th of July. No other plans for the next few weeks. She hasn't been eating the bread from the plan due to concerns about it being too much carbohydrates.  Subjective:   1. Essential hypertension Laura Haas's BP is well controlled. Pt denies chest pain/chest pressure/headache.  2. Vitamin D deficiency Laura Haas denies nausea, vomiting, and muscle weakness but notes fatigue. Pt is on prescription Vit D.  Assessment/Plan:   1. Essential hypertension Laura Haas is working on healthy weight loss and exercise to improve blood pressure control. We will watch for signs of hypotension as she continues her lifestyle modifications. Decrease Micardis to 1/2 tab daily at next appt. Follow up BP at next appt.  2. Vitamin D deficiency Low Vitamin D level contributes to fatigue and are associated with obesity, breast, and colon cancer. She agrees to continue to take prescription Vitamin D @50 ,000 IU every week and will follow-up for routine testing of Vitamin D, at least 2-3 times per year to avoid over-replacement.  3. Class 2 severe obesity with serious comorbidity and body mass index (BMI) of 38.0 to 38.9 in adult, unspecified obesity type [facility])  Laura Haas is currently in the action stage of change. As such, her goal  is to continue with weight loss efforts. She has agreed to the Category 2 Plan + 200 calories.   Exercise goals:  As is  Behavioral modification strategies: increasing lean protein intake, meal planning and cooking strategies, keeping healthy foods in the home, and planning for success.  Laura Haas has agreed to follow-up with our clinic in 2-3 weeks. She was informed of the importance of frequent follow-up visits to maximize her success with intensive lifestyle modifications for her multiple health conditions.   Objective:   Blood pressure 121/75, pulse 74, temperature 97.9 F (36.6 C), height 5\' 3"  (1.6 m), weight 208 lb (94.3 kg), SpO2 99 %. Body mass index is 36.85 kg/m.  General: Cooperative, alert, well developed, in no acute distress. HEENT: Conjunctivae and lids unremarkable. Cardiovascular: Regular rhythm.  Lungs: Normal work of breathing. Neurologic: No focal deficits.   Lab Results  Component Value Date   CREATININE 1.07 (H) 11/18/2020   BUN 23 11/18/2020   NA 139 11/18/2020   K 4.5 11/18/2020   CL 101 11/18/2020   CO2 21 11/18/2020   Lab Results  Component Value Date   ALT 44 (H) 11/18/2020   AST 31 11/18/2020   ALKPHOS 164 (H) 11/18/2020   BILITOT 0.5 11/18/2020   Lab Results  Component Value Date   HGBA1C 5.5 11/18/2020   Lab Results  Component Value Date   INSULIN 9.8 11/18/2020   Lab Results  Component Value Date   TSH 3.130 11/18/2020   Lab Results  Component Value Date   CHOL  275 (H) 11/18/2020   HDL 88 11/18/2020   LDLCALC 170 (H) 11/18/2020   TRIG 102 11/18/2020   Lab Results  Component Value Date   VD25OH 35.7 11/18/2020   Lab Results  Component Value Date   WBC 5.6 11/18/2020   HGB 14.2 11/18/2020   HCT 42.6 11/18/2020   MCV 92 11/18/2020   PLT 211 11/18/2020   No results found for: IRON, TIBC, FERRITIN  Attestation Statements:   Reviewed by clinician on day of visit: allergies, medications, problem list, medical history,  surgical history, family history, social history, and previous encounter notes.  Coral Ceo, CMA, am acting as transcriptionist for [name], MD.   I have reviewed the above documentation for accuracy and completeness, and I agree with the above. - Jinny Blossom, MD

## 2021-04-01 ENCOUNTER — Encounter (INDEPENDENT_AMBULATORY_CARE_PROVIDER_SITE_OTHER): Payer: Self-pay

## 2021-04-04 ENCOUNTER — Other Ambulatory Visit (INDEPENDENT_AMBULATORY_CARE_PROVIDER_SITE_OTHER): Payer: Self-pay | Admitting: Family Medicine

## 2021-04-04 DIAGNOSIS — E559 Vitamin D deficiency, unspecified: Secondary | ICD-10-CM

## 2021-04-07 ENCOUNTER — Ambulatory Visit (INDEPENDENT_AMBULATORY_CARE_PROVIDER_SITE_OTHER): Payer: Medicare Other | Admitting: Family Medicine

## 2021-04-13 ENCOUNTER — Encounter (INDEPENDENT_AMBULATORY_CARE_PROVIDER_SITE_OTHER): Payer: Self-pay | Admitting: Family Medicine

## 2021-04-13 ENCOUNTER — Ambulatory Visit (INDEPENDENT_AMBULATORY_CARE_PROVIDER_SITE_OTHER): Payer: Medicare Other | Admitting: Family Medicine

## 2021-04-13 ENCOUNTER — Other Ambulatory Visit: Payer: Self-pay

## 2021-04-13 VITALS — BP 131/83 | HR 83 | Temp 97.8°F | Ht 63.0 in | Wt 206.0 lb

## 2021-04-13 DIAGNOSIS — Z6838 Body mass index (BMI) 38.0-38.9, adult: Secondary | ICD-10-CM

## 2021-04-13 DIAGNOSIS — E559 Vitamin D deficiency, unspecified: Secondary | ICD-10-CM | POA: Diagnosis not present

## 2021-04-13 DIAGNOSIS — I10 Essential (primary) hypertension: Secondary | ICD-10-CM

## 2021-04-13 MED ORDER — VITAMIN D (ERGOCALCIFEROL) 1.25 MG (50000 UNIT) PO CAPS: 50000.0000 [IU] | ORAL_CAPSULE | ORAL | 0 refills | Status: DC

## 2021-04-17 NOTE — Progress Notes (Signed)
Chief Complaint:   OBESITY Laura Haas is here to discuss her progress with her obesity treatment plan along with follow-up of her obesity related diagnoses. Laura Haas is on the Category 2 Plan + 200 calories and states she is following her eating plan approximately 80% of the time. [name] states she is walking and doing water aerobics 60 minutes 2-7 times per week.  Today's visit was #: 6 Starting weight: 215 lbs Starting date: 11/18/2020 Today's weight: 206 lbs Today's date: 04/13/2021 Total lbs lost to date: 9 Total lbs lost since last in-office visit: 2  Interim History: This is Laura Haas's first return to clinic since 03/17/2021. She has been doing some personal activities of booking club and going out to dinner. She is using olive oil to make salad dressing. She is still working on getting in starch, not getting all those calories in. She is doing 4-6 oz of protein at night. Pt has a few upcoming dinners and a trip to [place], [place] and [place] at the end of the month. She has significantly cut back on wine.  Subjective:   1. Vitamin D deficiency [name] denies nausea, vomiting, and muscle weakness but notes fatigue. She is on prescription Vit D.  2. Essential hypertension [name]'s BP is well controlled today. Pt denies chest pain/chest pressure/headache. She is on Micardis 80 mg.  Assessment/Plan:   1. Vitamin D deficiency Low Vitamin D level contributes to fatigue and are associated with obesity, breast, and colon cancer. She agrees to continue to take prescription Vitamin D '@50'$ ,000 IU every week and will follow-up for routine testing of Vitamin D, at least 2-3 times per year to avoid over-replacement.  Refill- Vitamin D, Ergocalciferol, (DRISDOL) 1.25 MG (50000 UNIT) CAPS capsule; Take 1 capsule (50,000 Units total) by mouth every 7 (seven) days.  Dispense: 8 capsule; Refill: 0  2. Essential hypertension [name] will continue current medications with no change in dose. She  is working on healthy weight loss and exercise to improve blood pressure control. We will watch for signs of hypotension as she continues her lifestyle modifications.  3. Obesity with current BMI of 36.6  Laura Haas is currently in the action stage of change. As such, her goal is to continue with weight loss efforts. She has agreed to the Category 2 Plan + 200 calories.   Exercise goals:  As is  Behavioral modification strategies: increasing lean protein intake, meal planning and cooking strategies, keeping healthy foods in the home, and planning for success.  Laura Haas has agreed to follow-up with our clinic in 3 weeks, fasting. She was informed of the importance of frequent follow-up visits to maximize her success with intensive lifestyle modifications for her multiple health conditions.   Objective:   Blood pressure 131/83, pulse 83, temperature 97.8 F (36.6 C), height '5\' 3"'$  (1.6 m), weight 206 lb (93.4 kg), SpO2 98 %. Body mass index is 36.49 kg/m.  General: Cooperative, alert, well developed, in no acute distress. HEENT: Conjunctivae and lids unremarkable. Cardiovascular: Regular rhythm.  Lungs: Normal work of breathing. Neurologic: No focal deficits.   Lab Results  Component Value Date   CREATININE 1.07 (H) 11/18/2020   BUN 23 11/18/2020   NA 139 11/18/2020   K 4.5 11/18/2020   CL 101 11/18/2020   CO2 21 11/18/2020   Lab Results  Component Value Date   ALT 44 (H) 11/18/2020   AST 31 11/18/2020   ALKPHOS 164 (H) 11/18/2020   BILITOT 0.5 11/18/2020  Lab Results  Component Value Date   HGBA1C 5.5 11/18/2020   Lab Results  Component Value Date   INSULIN 9.8 11/18/2020   Lab Results  Component Value Date   TSH 3.130 11/18/2020   Lab Results  Component Value Date   CHOL 275 (H) 11/18/2020   HDL 88 11/18/2020   LDLCALC 170 (H) 11/18/2020   TRIG 102 11/18/2020   Lab Results  Component Value Date   VD25OH 35.7 11/18/2020   Lab Results  Component Value Date    WBC 5.6 11/18/2020   HGB 14.2 11/18/2020   HCT 42.6 11/18/2020   MCV 92 11/18/2020   PLT 211 11/18/2020   No results found for: IRON, TIBC, FERRITIN  Obesity Behavioral Intervention:   Approximately 15 minutes were spent on the discussion below.  ASK: We discussed the diagnosis of obesity with [name] today and Camiya agreed to give [place] permission to discuss obesity behavioral modification therapy today.  ASSESS: Laura Haas has the diagnosis of obesity and her BMI today is 36.6. Laura Haas is in the action stage of change.   ADVISE: Laura Haas was educated on the multiple health risks of obesity as well as the benefit of weight loss to improve her health. She was advised of the need for long term treatment and the importance of lifestyle modifications to improve her current health and to decrease her risk of future health problems.  AGREE: Multiple dietary modification options and treatment options were discussed and Laura Haas agreed to follow the recommendations documented in the above note.  ARRANGE: Laura Haas was educated on the importance of frequent visits to treat obesity as outlined per CMS and USPSTF guidelines and agreed to schedule her next follow up appointment today.  Attestation Statements:   Reviewed by clinician on day of visit: allergies, medications, problem list, medical history, surgical history, family history, social history, and previous encounter notes.  Coral Ceo, CMA, am acting as transcriptionist for [name], MD.  I have reviewed the above documentation for accuracy and completeness, and I agree with the above. - [name], MD

## 2021-04-22 ENCOUNTER — Other Ambulatory Visit: Payer: Self-pay | Admitting: Internal Medicine

## 2021-04-22 DIAGNOSIS — Z1231 Encounter for screening mammogram for malignant neoplasm of breast: Secondary | ICD-10-CM

## 2021-05-05 ENCOUNTER — Ambulatory Visit (INDEPENDENT_AMBULATORY_CARE_PROVIDER_SITE_OTHER): Payer: Medicare Other | Admitting: Family Medicine

## 2021-05-05 ENCOUNTER — Other Ambulatory Visit: Payer: Self-pay

## 2021-05-05 ENCOUNTER — Encounter (INDEPENDENT_AMBULATORY_CARE_PROVIDER_SITE_OTHER): Payer: Self-pay | Admitting: Family Medicine

## 2021-05-05 VITALS — BP 135/82 | HR 71 | Temp 97.7°F | Ht 63.0 in | Wt 205.0 lb

## 2021-05-05 DIAGNOSIS — Z6838 Body mass index (BMI) 38.0-38.9, adult: Secondary | ICD-10-CM | POA: Diagnosis not present

## 2021-05-05 DIAGNOSIS — R7401 Elevation of levels of liver transaminase levels: Secondary | ICD-10-CM | POA: Diagnosis not present

## 2021-05-05 DIAGNOSIS — E782 Mixed hyperlipidemia: Secondary | ICD-10-CM

## 2021-05-05 DIAGNOSIS — E559 Vitamin D deficiency, unspecified: Secondary | ICD-10-CM | POA: Diagnosis not present

## 2021-05-05 DIAGNOSIS — E8881 Metabolic syndrome: Secondary | ICD-10-CM | POA: Diagnosis not present

## 2021-05-05 DIAGNOSIS — R739 Hyperglycemia, unspecified: Secondary | ICD-10-CM | POA: Diagnosis not present

## 2021-05-05 NOTE — Progress Notes (Signed)
Chief Complaint:   OBESITY Laura Haas is here to discuss her progress with her obesity treatment plan along with follow-up of her obesity related diagnoses. Laura Haas is on the Category 2 Plan + 200 calories and states she is following her eating plan approximately 75% of the time. Laura Haas states she is walking 2500-3000 steps 7 times per week and deep water aerobics 60 minutes 2 times per week.  Today's visit was #: 7 Starting weight: 215 lbs Starting date: 11/18/2020 Today's weight: 205 lbs Today's date: 05/05/2021 Total lbs lost to date: 10 lbs Total lbs lost since last in-office visit: 1 lb  Interim History: Laura Haas thinks she is cyclically gaining and losing the same 3 pounds. She canceled trip to [facility] as brother had back surgery. Laura Haas is going to [facility] next weekend, planning on lots of hiking. Wants to start resistance training 2 times a week.  Subjective:   1. Transaminitis Laura Haas's last LFT's are elevated. Ultrasound in June showing possible steatosis.  2. Mixed hyperlipidemia Laura Haas's  HDL 88, triglycerides 102, and LDL 170.  Laura Haas is unable to tolerate statins due to myalgias.   3. Vitamin D deficiency Laura Haas is currently taking prescription vitamin D 50,000 IU each week. She denies nausea, vomiting or muscle weakness.  4. Insulin resistance Laura Haas has a diagnosis of insulin resistance based on her elevated fasting insulin level >5. She continues to work on diet and exercise to decrease her risk of diabetes. Not currently on medication.  Assessment/Plan:   1. Transaminitis Will check labs today will discuss at next office visit.  - Comprehensive metabolic panel today  2. Mixed hyperlipidemia Cardiovascular risk and specific lipid/LDL goals reviewed.  We discussed several lifestyle modifications today and Latrice will continue to work on diet, exercise and weight loss efforts. Orders and follow up as documented in patient record. Labs drawn today will  discuss at next office visit.  Counseling Intensive lifestyle modifications are the first line treatment for this issue. Dietary changes: Increase soluble fiber. Decrease simple carbohydrates. Exercise changes: Moderate to vigorous-intensity aerobic activity 150 minutes per week if tolerated. Lipid-lowering medications: see documented in medical record.   - Lipid Panel With LDL/HDL Ratio today  3. Vitamin D deficiency Low Vitamin D level contributes to fatigue and are associated with obesity, breast, and colon cancer. She agrees to hold off on taking prescription Vitamin D '@50'$ ,000 IU every week until lab results reviewed and will follow-up for routine testing of Vitamin D, at least 2-3 times per year to avoid over-replacement.   - VITAMIN D 25 Hydroxy (Vit-D Deficiency, Fractures) today  4. Insulin resistance Laura Haas will continue to work on weight loss, exercise, and decreasing simple carbohydrates to help decrease the risk of diabetes. Laura Haas agreed to follow-up with [place] as directed to closely monitor her progress. Will check labs today.  - Hemoglobin A1c  - Insulin, random   5. Obesity with current BMI of 36.3  Laura Haas is currently in the action stage of change. As such, her goal is to continue with weight loss efforts. She has agreed to the Category 2 Plan with 6oz of protein at dinner.   Exercise goals: Start resistance training at least 2 times a week.  Behavioral modification strategies: increasing lean protein intake, meal planning and cooking strategies, and keeping healthy foods in the home.  Laura Haas has agreed to follow-up with our clinic in 3 weeks. She was informed of the importance of frequent follow-up visits to maximize her success  with intensive lifestyle modifications for her multiple health conditions.   Laura Haas was informed we would discuss her lab results at her next visit unless there is a critical issue that needs to be addressed sooner. Laura Haas agreed to keep her  next visit at the agreed upon time to discuss these results.  Objective:   Blood pressure 135/82, pulse 71, temperature 97.7 F (36.5 C), height '5\' 3"'$  (1.6 m), weight 205 lb (93 kg), SpO2 98 %. Body mass index is 36.31 kg/m.  General: Cooperative, alert, well developed, in no acute distress. HEENT: Conjunctivae and lids unremarkable. Cardiovascular: Regular rhythm.  Lungs: Normal work of breathing. Neurologic: No focal deficits.   Lab Results  Component Value Date   CREATININE 1.07 (H) 11/18/2020   BUN 23 11/18/2020   NA 139 11/18/2020   K 4.5 11/18/2020   CL 101 11/18/2020   CO2 21 11/18/2020   Lab Results  Component Value Date   ALT 44 (H) 11/18/2020   AST 31 11/18/2020   ALKPHOS 164 (H) 11/18/2020   BILITOT 0.5 11/18/2020   Lab Results  Component Value Date   HGBA1C 5.5 11/18/2020   Lab Results  Component Value Date   INSULIN 9.8 11/18/2020   Lab Results  Component Value Date   TSH 3.130 11/18/2020   Lab Results  Component Value Date   CHOL 275 (H) 11/18/2020   HDL 88 11/18/2020   LDLCALC 170 (H) 11/18/2020   TRIG 102 11/18/2020   Lab Results  Component Value Date   VD25OH 35.7 11/18/2020   Lab Results  Component Value Date   WBC 5.6 11/18/2020   HGB 14.2 11/18/2020   HCT 42.6 11/18/2020   MCV 92 11/18/2020   PLT 211 11/18/2020   No results found for: IRON, TIBC, FERRITIN  Obesity Behavioral Intervention:   Approximately 15 minutes were spent on the discussion below.  ASK: We discussed the diagnosis of obesity with Laura Haas today and Laura Haas agreed to give [place] permission to discuss obesity behavioral modification therapy today.  ASSESS: Laura Haas has the diagnosis of obesity and her BMI today is 36.3. Laura Haas is in the action stage of change.   ADVISE: Laura Haas was educated on the multiple health risks of obesity as well as the benefit of weight loss to improve her health. She was advised of the need for long term treatment and the importance of  lifestyle modifications to improve her current health and to decrease her risk of future health problems.  AGREE: Multiple dietary modification options and treatment options were discussed and Laura Haas agreed to follow the recommendations documented in the above note.  ARRANGE: Laura Haas was educated on the importance of frequent visits to treat obesity as outlined per CMS and USPSTF guidelines and agreed to schedule her next follow up appointment today.  Attestation Statements:   Reviewed by clinician on day of visit: allergies, medications, problem list, medical history, surgical history, family history, social history, and previous encounter notes.    I, Christan McMullen LPN, am acting as transcriptionist for Gypsy Balsam, MD.  I have reviewed the above documentation for accuracy and completeness, and I agree with the above. - [name], MD

## 2021-05-06 LAB — COMPREHENSIVE METABOLIC PANEL
ALT: 40 IU/L — ABNORMAL HIGH (ref 0–32)
AST: 26 IU/L (ref 0–40)
Albumin/Globulin Ratio: 2.3 — ABNORMAL HIGH (ref 1.2–2.2)
Albumin: 4.6 g/dL (ref 3.7–4.7)
Alkaline Phosphatase: 134 IU/L — ABNORMAL HIGH (ref 44–121)
BUN/Creatinine Ratio: 18 (ref 12–28)
BUN: 20 mg/dL (ref 8–27)
Bilirubin Total: 0.4 mg/dL (ref 0.0–1.2)
CO2: 24 mmol/L (ref 20–29)
Calcium: 9.6 mg/dL (ref 8.7–10.3)
Chloride: 103 mmol/L (ref 96–106)
Creatinine, Ser: 1.14 mg/dL — ABNORMAL HIGH (ref 0.57–1.00)
Globulin, Total: 2 g/dL (ref 1.5–4.5)
Glucose: 93 mg/dL (ref 65–99)
Potassium: 4.4 mmol/L (ref 3.5–5.2)
Sodium: 141 mmol/L (ref 134–144)
Total Protein: 6.6 g/dL (ref 6.0–8.5)
eGFR: 51 mL/min/{1.73_m2} — ABNORMAL LOW (ref >59)

## 2021-05-06 LAB — HEMOGLOBIN A1C
Est. average glucose Bld gHb Est-mCnc: 114 mg/dL
Hgb A1c MFr Bld: 5.6 % (ref 4.8–5.6)

## 2021-05-06 LAB — INSULIN, RANDOM: INSULIN: 6.9 u[IU]/mL (ref 2.6–24.9)

## 2021-05-06 LAB — LIPID PANEL WITH LDL/HDL RATIO
Cholesterol, Total: 234 mg/dL — ABNORMAL HIGH (ref 100–199)
HDL: 75 mg/dL (ref >39)
LDL Chol Calc (NIH): 141 mg/dL — ABNORMAL HIGH (ref 0–99)
LDL/HDL Ratio: 1.9 ratio (ref 0.0–3.2)
Triglycerides: 103 mg/dL (ref 0–149)
VLDL Cholesterol Cal: 18 mg/dL (ref 5–40)

## 2021-05-06 LAB — VITAMIN D 25 HYDROXY (VIT D DEFICIENCY, FRACTURES): Vit D, 25-Hydroxy: 47.5 ng/mL (ref 30.0–100.0)

## 2021-06-01 ENCOUNTER — Other Ambulatory Visit: Payer: Self-pay

## 2021-06-01 ENCOUNTER — Encounter (INDEPENDENT_AMBULATORY_CARE_PROVIDER_SITE_OTHER): Payer: Self-pay | Admitting: Family Medicine

## 2021-06-01 ENCOUNTER — Ambulatory Visit (INDEPENDENT_AMBULATORY_CARE_PROVIDER_SITE_OTHER): Payer: Medicare Other | Admitting: Family Medicine

## 2021-06-01 VITALS — BP 126/80 | HR 74 | Temp 97.8°F | Ht 63.0 in | Wt 205.0 lb

## 2021-06-01 DIAGNOSIS — E559 Vitamin D deficiency, unspecified: Secondary | ICD-10-CM | POA: Diagnosis not present

## 2021-06-01 DIAGNOSIS — E7849 Other hyperlipidemia: Secondary | ICD-10-CM | POA: Diagnosis not present

## 2021-06-01 DIAGNOSIS — Z6838 Body mass index (BMI) 38.0-38.9, adult: Secondary | ICD-10-CM

## 2021-06-02 NOTE — Progress Notes (Signed)
Chief Complaint:   OBESITY Laura Haas is here to discuss her progress with her obesity treatment plan along with follow-up of her obesity related diagnoses. Laura Haas is on the Category 2 Plan and states she is following her eating plan approximately 70% of the time. Essance states she is walking, water aerobics, and bands 20-60 minutes 2-3 times per week.  Today's visit was #: 8 Starting weight: 215 lbs Starting date: 11/18/2020 Today's weight: 205 lbs Today's date: 06/01/2021 Total lbs lost to date: 10 Total lbs lost since last in-office visit: 0  Interim History: Laura Haas had a week at [employer], daughter had a gastric sleeve, son-in-law had emergent appendectomy, and she is planning her husbands 75th birthday party over the last 3 weeks. She is going to the beach for 6 days. With everything going on, she has been about 70% on plan.   Subjective:   1. Vitamin D deficiency Laura Haas denies nausea, vomiting, and muscle weakness but notes fatigue. Pt is on prescription Vit D.  2. Other hyperlipidemia Her LDL improved from 170 to 141, but HDL decreased from 88 to 75.   Assessment/Plan:   1. Vitamin D deficiency Low Vitamin D level contributes to fatigue and are associated with obesity, breast, and colon cancer. She agrees to continue to take prescription Vitamin D 50,000 IU every week and will follow-up for routine testing of Vitamin D, at least 2-3 times per year to avoid over-replacement.  Refill Vit D 50,000 IU weekly, Disp #4, 0 Refills  2. Other hyperlipidemia Cardiovascular risk and specific lipid/LDL goals reviewed.  We discussed several lifestyle modifications today and Ivanell will continue to work on diet, exercise and weight loss efforts. Orders and follow up as documented in patient record. Repeat labs in 3 months.  Counseling Intensive lifestyle modifications are the first line treatment for this issue. Dietary changes: Increase soluble fiber. Decrease simple  carbohydrates. Exercise changes: Moderate to vigorous-intensity aerobic activity 150 minutes per week if tolerated. Lipid-lowering medications: see documented in medical record.  3. Obesity with current BMI of 36.4  Laura Haas is currently in the action stage of change. As such, her goal is to continue with weight loss efforts. She has agreed to the Category 2 Plan.   Exercise goals: All adults should avoid inactivity. Some physical activity is better than none, and adults who participate in any amount of physical activity gain some health benefits.  Behavioral modification strategies: increasing lean protein intake, meal planning and cooking strategies, keeping healthy foods in the home, and planning for success.  Laura Haas has agreed to follow-up with our clinic in 3 weeks. She was informed of the importance of frequent follow-up visits to maximize her success with intensive lifestyle modifications for her multiple health conditions.   Objective:   Blood pressure 126/80, pulse 74, temperature 97.8 F (36.6 C), height '5\' 3"'$  (1.6 m), weight 205 lb (93 kg), SpO2 99 %. Body mass index is 36.31 kg/m.  General: Cooperative, alert, well developed, in no acute distress. HEENT: Conjunctivae and lids unremarkable. Cardiovascular: Regular rhythm.  Lungs: Normal work of breathing. Neurologic: No focal deficits.   Lab Results  Component Value Date   CREATININE 1.14 (H) 05/05/2021   BUN 20 05/05/2021   NA 141 05/05/2021   K 4.4 05/05/2021   CL 103 05/05/2021   CO2 24 05/05/2021   Lab Results  Component Value Date   ALT 40 (H) 05/05/2021   AST 26 05/05/2021   ALKPHOS 134 (H) 05/05/2021  BILITOT 0.4 05/05/2021   Lab Results  Component Value Date   HGBA1C 5.6 05/05/2021   HGBA1C 5.5 11/18/2020   Lab Results  Component Value Date   INSULIN 6.9 05/05/2021   INSULIN 9.8 11/18/2020   Lab Results  Component Value Date   TSH 3.130 11/18/2020   Lab Results  Component Value Date   CHOL  234 (H) 05/05/2021   HDL 75 05/05/2021   LDLCALC 141 (H) 05/05/2021   TRIG 103 05/05/2021   Lab Results  Component Value Date   VD25OH 47.5 05/05/2021   VD25OH 35.7 11/18/2020   Lab Results  Component Value Date   WBC 5.6 11/18/2020   HGB 14.2 11/18/2020   HCT 42.6 11/18/2020   MCV 92 11/18/2020   PLT 211 11/18/2020   No results found for: IRON, TIBC, FERRITIN  Obesity Behavioral Intervention:   Approximately 15 minutes were spent on the discussion below.  ASK: We discussed the diagnosis of obesity with Laura Haas today and Laura Haas agreed to give [place] permission to discuss obesity behavioral modification therapy today.  ASSESS: Trenace has the diagnosis of obesity and her BMI today is 36.4. Aggie is in the action stage of change.   ADVISE: Laura Haas was educated on the multiple health risks of obesity as well as the benefit of weight loss to improve her health. She was advised of the need for long term treatment and the importance of lifestyle modifications to improve her current health and to decrease her risk of future health problems.  AGREE: Multiple dietary modification options and treatment options were discussed and Laura Haas agreed to follow the recommendations documented in the above note.  ARRANGE: Laura Haas was educated on the importance of frequent visits to treat obesity as outlined per CMS and USPSTF guidelines and agreed to schedule her next follow up appointment today.  Attestation Statements:   Reviewed by clinician on day of visit: allergies, medications, problem list, medical history, surgical history, family history, social history, and previous encounter notes.  Coral Ceo, CMA, am acting as transcriptionist for [name], MD.   I have reviewed the above documentation for accuracy and completeness, and I agree with the above. - [name], MD

## 2021-06-03 ENCOUNTER — Observation Stay (HOSPITAL_COMMUNITY)
Admission: EM | Admit: 2021-06-03 | Discharge: 2021-06-04 | Disposition: A | Discharge status: Home / Self Care | Payer: Medicare Other | Attending: Internal Medicine | Admitting: Internal Medicine

## 2021-06-03 ENCOUNTER — Other Ambulatory Visit: Payer: Self-pay

## 2021-06-03 ENCOUNTER — Emergency Department (HOSPITAL_COMMUNITY): Payer: Medicare Other

## 2021-06-03 ENCOUNTER — Encounter (HOSPITAL_COMMUNITY): Payer: Self-pay

## 2021-06-03 DIAGNOSIS — K625 Hemorrhage of anus and rectum: Secondary | ICD-10-CM | POA: Diagnosis not present

## 2021-06-03 DIAGNOSIS — R1110 Vomiting, unspecified: Secondary | ICD-10-CM | POA: Diagnosis not present

## 2021-06-03 DIAGNOSIS — K922 Gastrointestinal hemorrhage, unspecified: Secondary | ICD-10-CM | POA: Diagnosis not present

## 2021-06-03 DIAGNOSIS — Z79899 Other long term (current) drug therapy: Secondary | ICD-10-CM | POA: Insufficient documentation

## 2021-06-03 DIAGNOSIS — I10 Essential (primary) hypertension: Secondary | ICD-10-CM | POA: Insufficient documentation

## 2021-06-03 DIAGNOSIS — Z87891 Personal history of nicotine dependence: Secondary | ICD-10-CM | POA: Diagnosis not present

## 2021-06-03 DIAGNOSIS — Z20822 Contact with and (suspected) exposure to covid-19: Secondary | ICD-10-CM | POA: Insufficient documentation

## 2021-06-03 DIAGNOSIS — R1084 Generalized abdominal pain: Secondary | ICD-10-CM | POA: Diagnosis not present

## 2021-06-03 DIAGNOSIS — K921 Melena: Secondary | ICD-10-CM

## 2021-06-03 DIAGNOSIS — K529 Noninfective gastroenteritis and colitis, unspecified: Secondary | ICD-10-CM | POA: Diagnosis not present

## 2021-06-03 DIAGNOSIS — R1030 Lower abdominal pain, unspecified: Secondary | ICD-10-CM | POA: Diagnosis not present

## 2021-06-03 LAB — CBC
HCT: 41 % (ref 36.0–46.0)
Hemoglobin: 13.9 g/dL (ref 12.0–15.0)
MCH: 31.2 pg (ref 26.0–34.0)
MCHC: 33.9 g/dL (ref 30.0–36.0)
MCV: 92.1 fL (ref 80.0–100.0)
Platelets: 191 10*3/uL (ref 150–400)
RBC: 4.45 MIL/uL (ref 3.87–5.11)
RDW: 13.2 % (ref 11.5–15.5)
WBC: 12.9 10*3/uL — ABNORMAL HIGH (ref 4.0–10.5)
nRBC: 0 % (ref 0.0–0.2)

## 2021-06-03 LAB — COMPREHENSIVE METABOLIC PANEL
ALT: 44 U/L (ref 0–44)
AST: 35 U/L (ref 15–41)
Albumin: 4.4 g/dL (ref 3.5–5.0)
Alkaline Phosphatase: 113 U/L (ref 38–126)
Anion gap: 9 (ref 5–15)
BUN: 18 mg/dL (ref 8–23)
CO2: 22 mmol/L (ref 22–32)
Calcium: 9.6 mg/dL (ref 8.9–10.3)
Chloride: 106 mmol/L (ref 98–111)
Creatinine, Ser: 1.19 mg/dL — ABNORMAL HIGH (ref 0.44–1.00)
GFR, Estimated: 48 mL/min — ABNORMAL LOW (ref >60)
Glucose, Bld: 150 mg/dL — ABNORMAL HIGH (ref 70–99)
Potassium: 4.2 mmol/L (ref 3.5–5.1)
Sodium: 137 mmol/L (ref 135–145)
Total Bilirubin: 1 mg/dL (ref 0.3–1.2)
Total Protein: 7.2 g/dL (ref 6.5–8.1)

## 2021-06-03 LAB — RESP PANEL BY RT-PCR (FLU A&B, COVID) ARPGX2
Influenza A by PCR: NEGATIVE
Influenza B by PCR: NEGATIVE
SARS Coronavirus 2 by RT PCR: NEGATIVE

## 2021-06-03 LAB — HEMOGLOBIN AND HEMATOCRIT, BLOOD
HCT: 39.5 % (ref 36.0–46.0)
Hemoglobin: 13.3 g/dL (ref 12.0–15.0)

## 2021-06-03 LAB — TYPE AND SCREEN
ABO/RH(D): O POS
Antibody Screen: NEGATIVE

## 2021-06-03 LAB — ABO/RH: ABO/RH(D): O POS

## 2021-06-03 LAB — LIPASE, BLOOD: Lipase: 29 U/L (ref 11–51)

## 2021-06-03 MED ORDER — ONDANSETRON HCL 4 MG PO TABS: 4.0000 mg | ORAL_TABLET | Freq: Four times a day (QID) | ORAL | Status: DC | PRN

## 2021-06-03 MED ORDER — IRBESARTAN 300 MG PO TABS
300.0000 mg | ORAL_TABLET | Freq: Every day | ORAL | Status: DC
  Administered 2021-06-03: 300 mg via ORAL
  Filled 2021-06-03: qty 1

## 2021-06-03 MED ORDER — SODIUM CHLORIDE 0.9 % IV SOLN
2.0000 g | INTRAVENOUS | Status: DC
  Filled 2021-06-03: qty 20

## 2021-06-03 MED ORDER — ACETAMINOPHEN 325 MG PO TABS: 650.0000 mg | ORAL_TABLET | Freq: Four times a day (QID) | ORAL | Status: DC | PRN

## 2021-06-03 MED ORDER — LACTATED RINGERS IV BOLUS
1000.0000 mL | Freq: Once | INTRAVENOUS | Status: AC
  Administered 2021-06-03: 1000 mL via INTRAVENOUS

## 2021-06-03 MED ORDER — METRONIDAZOLE 500 MG/100ML IV SOLN
500.0000 mg | Freq: Once | INTRAVENOUS | Status: AC
  Administered 2021-06-03: 500 mg via INTRAVENOUS
  Filled 2021-06-03: qty 100

## 2021-06-03 MED ORDER — VITAMIN D (ERGOCALCIFEROL) 1.25 MG (50000 UNIT) PO CAPS
50000.0000 [IU] | ORAL_CAPSULE | ORAL | Status: DC
  Administered 2021-06-03: 50000 [IU] via ORAL
  Filled 2021-06-03: qty 1

## 2021-06-03 MED ORDER — ZOLPIDEM TARTRATE 5 MG PO TABS: 5.0000 mg | ORAL_TABLET | Freq: Every evening | ORAL | Status: DC | PRN

## 2021-06-03 MED ORDER — ONDANSETRON HCL 4 MG/2ML IJ SOLN: 4.0000 mg | Freq: Four times a day (QID) | INTRAMUSCULAR | Status: DC | PRN

## 2021-06-03 MED ORDER — IOHEXOL 350 MG/ML SOLN
100.0000 mL | Freq: Once | INTRAVENOUS | Status: AC | PRN
  Administered 2021-06-03: 80 mL via INTRAVENOUS

## 2021-06-03 MED ORDER — SODIUM CHLORIDE 0.9 % IV SOLN
2.0000 g | INTRAVENOUS | Status: DC
  Administered 2021-06-03: 2 g via INTRAVENOUS
  Filled 2021-06-03: qty 20

## 2021-06-03 MED ORDER — ACETAMINOPHEN 650 MG RE SUPP: 650.0000 mg | Freq: Four times a day (QID) | RECTAL | Status: DC | PRN

## 2021-06-03 MED ORDER — POLYVINYL ALCOHOL 1.4 % OP SOLN: 1.0000 [drp] | Freq: Every day | OPHTHALMIC | Status: DC | PRN

## 2021-06-03 MED ORDER — CIPROFLOXACIN IN D5W 400 MG/200ML IV SOLN
400.0000 mg | Freq: Once | INTRAVENOUS | Status: AC
  Administered 2021-06-03: 400 mg via INTRAVENOUS
  Filled 2021-06-03: qty 200

## 2021-06-03 MED ORDER — MAGNESIUM OXIDE -MG SUPPLEMENT 400 (240 MG) MG PO TABS
400.0000 mg | ORAL_TABLET | Freq: Every day | ORAL | Status: DC
  Administered 2021-06-03: 400 mg via ORAL
  Filled 2021-06-03: qty 1

## 2021-06-03 MED ORDER — LACTATED RINGERS IV SOLN: INTRAVENOUS | Status: DC

## 2021-06-03 MED ORDER — CIPROFLOXACIN IN D5W 400 MG/200ML IV SOLN: 400.0000 mg | Freq: Two times a day (BID) | INTRAVENOUS | Status: DC

## 2021-06-03 MED ORDER — HYDROXYCHLOROQUINE SULFATE 200 MG PO TABS
200.0000 mg | ORAL_TABLET | Freq: Every day | ORAL | Status: DC
  Administered 2021-06-03: 200 mg via ORAL
  Filled 2021-06-03 (×2): qty 1

## 2021-06-03 NOTE — ED Notes (Signed)
Hemoccult Card placed at bedside.

## 2021-06-03 NOTE — ED Provider Notes (Signed)
[facility] DEPT Provider Note   CSN: [id number] Arrival date & time: 06/03/21  0915     History Chief Complaint  Patient presents with   Blood In [place]ill is a 75 y.o. female.  75 year old female presents with several days of abdominal cramping with associated bright red blood per rectum.  Patient has been having issues with IBS and was scheduled to have a colonoscopy in 2 weeks.  He denies any fever or chills.  Abdominal cramping is diffuse.  Does not take any blood thinners.  Does feel somewhat lightheaded.  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   Shortness of breath    Sinus infection    3 weeks ago   Vitamin D deficiency     Patient Active Problem List   Diagnosis Date Noted   Palpitations 02/24/2015    Past Surgical History:  Procedure Laterality Date   ACHILLES TENDON SURGERY  09/06/2018   [facility] Procedure   BREAST BIOPSY Right yrs ago   benign   BREAST EXCISIONAL BIOPSY Right years ago   benign   catheter ablation of heart   20000   COLONOSCOPY WITH PROPOFOL N/A 05/21/2013   Procedure: COLONOSCOPY WITH PROPOFOL;  Surgeon: Garlan Fair, MD;  Location: [facility];  Service: Endoscopy;  Laterality: N/A;   EYE SURGERY     left catract 1996   NECK SURGERY  age 63   surgery on neck blood vessels   TUBAL LIGATION  yrs ago     OB History     Gravida  3   Para      Term      Preterm      AB      Living         SAB      IAB      Ectopic      Multiple      Live Births               Family History  Problem Relation Age of Onset   Diverticulitis Father     Social History   Tobacco Use   Smoking status: Former    Types: Cigarettes    Quit date: 09/19/1966    Years since quitting: 54.7   Smokeless tobacco: Never   Tobacco comments:    social smoke as teenager  Vaping Use   Vaping Use: Never used  Substance Use Topics   Alcohol use: Yes    Alcohol/week: 7.0 standard drinks    Types: 7 Glasses of wine per week    Comment: glass of wine q week   Drug use: No    Home Medications Prior to Admission medications   Medication Sig Start Date End Date Taking? Authorizing Provider  Cholecalciferol (VITAMIN D3) 25 MCG (1000 UT) CAPS Take by mouth.    [provider]  diclofenac Sodium (VOLTAREN) 1 % GEL Apply topically 4 (four) times daily.    [provider]  hydroxychloroquine (PLAQUENIL) 200 MG tablet Take by mouth daily.    [provider]  MAGNESIUM PO Take 1 capsule by mouth daily. Pure Magnesium 120 mg    [provider]  meclizine (ANTIVERT) 25 MG tablet Take 25 mg by mouth 3 (three) times daily as needed for dizziness.    [provider]  Nutritional Supplements (NUTRITIONAL SUPPLEMENT PO) Take by mouth. Omega Plus with Reservatrol    [provider]  Nutritional Supplements (NUTRITIONAL SUPPLEMENT PO) Take by mouth. Advanced Bionutritional Curcumital-Q    [provider]  Nutritional Supplements (NUTRITIONAL SUPPLEMENT PO) Take by mouth. Vital Proteins-Collagen peptides    [provider]  Probiotic Product (PROBIOTIC-10 PO) Take by mouth. Healthy Heart Probiotic    [provider]  telmisartan (MICARDIS) 80 MG tablet 80 mg. 04/20/17   [provider]  Vitamin D, Ergocalciferol, (DRISDOL) 1.25 MG (50000 UNIT) CAPS capsule Take 1 capsule (50,000 Units total) by mouth every 7 (seven) days. 04/13/21   Laqueta Linden, MD  Wheat Dextrin (BENEFIBER PO) Take by mouth.  2T BID    [provider]  zolpidem (AMBIEN) 5 MG tablet Ambien 5 mg tablet  Take 1 tablet every day by oral route.    [provider]    Allergies    Allegra [fexofenadine hcl], Dye fdc red [red dye], Fexofenadine, Lisinopril, and Pravastatin  Review of Systems   Review of Systems  All other systems reviewed and are negative.  Physical Exam Updated Vital Signs BP (!) 156/111 (BP Location: Left Arm)   Pulse 98   Temp 97.8 F (36.6 C) (Oral)   Resp 16   Ht 1.6 m ('5\' 3"'$ )   Wt 91.6 kg   SpO2 97%   BMI 35.78 kg/m   Physical Exam Vitals and nursing note reviewed.  Constitutional:      General: She is not in acute distress.    Appearance: Normal appearance. She is well-developed. She is not toxic-appearing.  HENT:     Head: Normocephalic and atraumatic.  Eyes:     General: Lids are normal.     Conjunctiva/sclera: Conjunctivae normal.     Pupils: Pupils are equal, round, and reactive to light.  Neck:     Thyroid: No thyroid mass.     Trachea: No tracheal deviation.  Cardiovascular:     Rate and Rhythm: Normal rate and regular rhythm.     Heart sounds: Normal heart sounds. No murmur heard.   No gallop.  Pulmonary:     Effort: Pulmonary effort is normal. No respiratory distress.     Breath sounds: Normal breath sounds. No stridor. No decreased breath sounds, wheezing, rhonchi or rales.  Abdominal:     General: There is no distension.     Palpations: Abdomen is soft.     Tenderness: There is no abdominal tenderness. There is no rebound.  Musculoskeletal:        General: No tenderness. Normal range of motion.     Cervical back: Normal range of motion and neck supple.  Skin:    General: Skin is warm and dry.     Findings: No abrasion or rash.  Neurological:     Mental Status: She is alert and oriented to person, place, and time. Mental status is at baseline.     GCS: GCS eye subscore is 4. GCS verbal subscore is  5. GCS motor subscore is 6.     Cranial  Nerves: Cranial nerves are intact. No cranial nerve deficit.     Sensory: No sensory deficit.     Motor: Motor function is intact.  Psychiatric:        Attention and Perception: Attention normal.        Speech: Speech normal.        Behavior: Behavior normal.    ED Results / Procedures / Treatments   Labs (all labs ordered are listed, but only abnormal results are displayed) Labs Reviewed  COMPREHENSIVE METABOLIC PANEL  CBC  POC OCCULT BLOOD, ED  TYPE AND SCREEN    EKG None  Radiology No results found.  Procedures Procedures   Medications Ordered in ED Medications  lactated ringers infusion (has no administration in time range)    ED Course  I have reviewed the triage vital signs and the nursing notes.  Pertinent labs & imaging results that were available during my care of the patient were reviewed by me and considered in my medical decision making (see chart for details).    MDM Rules/Calculators/A&P                           Patient's labs and imaging reviewed and consistent with colitis.  Patient is already being seen by her gastroenterologist recommends admission with IV fluids and antibiotics.  Will admit to the hospitalist service Final Clinical Impression(s) / ED Diagnoses Final diagnoses:  None    Rx / DC Orders ED Discharge Orders     None        Lacretia Leigh, MD 06/03/21 1237

## 2021-06-03 NOTE — Consult Note (Signed)
Referring Provider: Dr. Lacretia Leigh Primary Care Physician:  Josetta Huddle, MD Primary Gastroenterologist:  Dr. Therisa Doyne  Reason for Consultation:  AB pain and hematochezia  HPI: Laura Haas is a 75 y.o. female with history of hypertension, hyperlipidemia, obesity, remote history of SVT status post ablation 2001, pan diverticulosis presents to the ER with abdominal pain and rectal bleeding. Colonoscopy 2014 by Dr. Wynetta Emery for screening showed pancolonic diverticulosis recall 10 years. Denies family history of colon cancer Patient is known to Dr. Therisa Doyne in the office, last seen 02/03/2021 for abdominal discomfort and alternating bowel habits with constipation and diarrhea.   Patient was set up for screening colonoscopy with Dr. Therisa Doyne 06/17/2021 outpatient. Significant labs in ER show white blood cell count 12.9, no anemia  creatinine 1.19, baseline 1.14-1.07, liver function normal AST 35 ALT 44, alkaline phosphatase 113.  Lipase negative at 29 Pending CT abdomen pelvis with contrast  Patient states has never had an episode like this in the past. Patient states yesterday started to have abdominal cramping, acute pain with cold sweats nausea and large loose bowel movements. This happened every several hours, around 6 PM started to have bright red blood large amounts per rectum with diarrhea and abdominal cramping. Between episodes patient was pain-free. Patient has good appetite, nausea only with episodes of abdominal pain.  No episodes of vomiting. Denies fever, chills Patient has lost 10 pounds intentionally with [facility]. Only surgery in the past has been tubal ligation. 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   Shortness of breath    Sinus infection    3 weeks ago   Vitamin D deficiency     Past Surgical History:  Procedure Laterality Date   ACHILLES TENDON SURGERY  09/06/2018   [facility] Procedure   BREAST BIOPSY Right yrs ago   benign   BREAST EXCISIONAL BIOPSY Right years ago   benign   catheter ablation of heart   20000   COLONOSCOPY WITH PROPOFOL N/A 05/21/2013   Procedure: COLONOSCOPY WITH PROPOFOL;  Surgeon: Garlan Fair, MD;  Location: [facility];  Service: Endoscopy;  Laterality: N/A;   EYE SURGERY     left catract 1996   NECK SURGERY  age 34   surgery on neck blood vessels   TUBAL LIGATION  yrs ago    Prior to Admission medications   Medication Sig Start Date End Date Taking? Authorizing Provider  Cholecalciferol (VITAMIN D3) 25 MCG (1000 UT) CAPS Take by mouth.    [provider]  diclofenac Sodium (VOLTAREN) 1 % GEL Apply topically 4 (four) times daily.    [provider]  hydroxychloroquine (PLAQUENIL) 200 MG tablet Take by mouth daily.    [provider]  MAGNESIUM PO Take 1 capsule by mouth daily. Pure Magnesium 120 mg    [provider]  meclizine (ANTIVERT) 25 MG tablet Take 25 mg by  mouth 3 (three) times daily as needed for dizziness.    [provider]  Nutritional Supplements (NUTRITIONAL SUPPLEMENT PO) Take by mouth. Omega Plus with Reservatrol    [provider]  Nutritional Supplements (NUTRITIONAL SUPPLEMENT PO) Take by mouth. Advanced Bionutritional Curcumital-Q    [provider]  Nutritional Supplements (NUTRITIONAL SUPPLEMENT PO) Take by mouth. Vital Proteins-Collagen peptides    [provider]  Probiotic Product (PROBIOTIC-10 PO) Take by mouth. Healthy Heart Probiotic    [provider]   telmisartan (MICARDIS) 80 MG tablet 80 mg. 04/20/17   [provider]  Vitamin D, Ergocalciferol, (DRISDOL) 1.25 MG (50000 UNIT) CAPS capsule Take 1 capsule (50,000 Units total) by mouth every 7 (seven) days. 04/13/21   Laqueta Linden, MD  Wheat Dextrin (BENEFIBER PO) Take by mouth. 2T BID    [provider]  zolpidem (AMBIEN) 5 MG tablet Ambien 5 mg tablet  Take 1 tablet every day by oral route.    [provider]    Scheduled Meds: Continuous Infusions:  lactated ringers 125 mL/hr at 06/03/21 1000   PRN Meds:.  Allergies as of 06/03/2021 - Review Complete 06/03/2021  Allergen Reaction Noted   Allegra [fexofenadine hcl] Other (See Comments) 03/29/2013   Dye fdc red [red dye] Other (See Comments) 03/29/2013   Fexofenadine Other (See Comments) 05/26/2016   Lisinopril Cough 11/18/2020   Pravastatin  11/18/2020    Family History  Problem Relation Age of Onset   Diverticulitis Father     Social History   Socioeconomic History   Marital status: Married    Spouse name: Not on file   Number of children: Not on file   Years of education: Not on file   Highest education level: Not on file  Occupational History   Occupation: Retired  Tobacco Use   Smoking status: Former    Types: Cigarettes    Quit date: 09/19/1966    Years since quitting: 54.7   Smokeless tobacco: Never   Tobacco comments:    social smoke as teenager  Vaping Use   Vaping Use: Never used  Substance and Sexual Activity   Alcohol use: Yes    Alcohol/week: 7.0 standard drinks    Types: 7 Glasses of wine per week    Comment: glass of wine q week   Drug use: No   Sexual activity: Not on file  Other Topics Concern   Not on file  Social History Narrative   Not on file   Social Determinants of Health   Financial Resource Strain: Not on file  Food Insecurity: Not on file  Transportation Needs: Not on file  Physical Activity: Not on file  Stress: Not on file  Social  Connections: Not on file  Intimate Partner Violence: Not on file    Review of Systems:  Review of Systems  Constitutional:  Negative for chills, fever and malaise/fatigue.  Respiratory:  Negative for cough and shortness of breath.   Cardiovascular:  Negative for chest pain.  Gastrointestinal:  Positive for abdominal pain, blood in stool, diarrhea and nausea. Negative for constipation, heartburn, melena and vomiting.  Musculoskeletal:  Negative for falls.  Skin:  Negative for itching.  Neurological:  Negative for weakness.    Physical Exam: Vital signs: Vitals:   06/03/21 1000 06/03/21 1028  BP: (!) 152/73 (!) 152/73  Pulse: 85 92  Resp: 18 18  Temp:    SpO2: 97% 99%     Physical Exam Constitutional:  General: She is not in acute distress.    Appearance: Normal appearance. She is not ill-appearing.  Cardiovascular:     Rate and Rhythm: Normal rate and regular rhythm.     Pulses: Normal pulses.     Heart sounds: No murmur heard. Pulmonary:     Effort: Pulmonary effort is normal.     Breath sounds: Normal breath sounds.  Abdominal:     General: Abdomen is flat. Bowel sounds are increased. There is no distension.     Palpations: Abdomen is soft. There is no hepatomegaly.     Tenderness: There is generalized abdominal tenderness. There is no guarding or rebound.     Hernia: No hernia is present.  Neurological:     Mental Status: She is alert.     GI:  Lab Results: Recent Labs    06/03/21 0947  WBC 12.9*  HGB 13.9  HCT 41.0  PLT 191   BMET Recent Labs    06/03/21 0947  NA 137  K 4.2  CL 106  CO2 22  GLUCOSE 150*  BUN 18  CREATININE 1.19*  CALCIUM 9.6   LFT Recent Labs    06/03/21 0947  PROT 7.2  ALBUMIN 4.4  AST 35  ALT 44  ALKPHOS 113  BILITOT 1.0   PT/INR No results for input(s): LABPROT, INR in the last 72 hours.  Studies/Results: No results found.  Impression and Plan AB pain and hematochezia white blood cell count 12.9, no  anemia  History of pancolonic diverticulosis, last colonoscopy 2014 normal recall 10 years normal recall 10 years- set up for screening colonoscopy with Dr. Therisa Doyne 06/17/2021 outpatient. Pending CT abdomen pelvis with contrast Differential includes ischemic colitis, diverticulitis, infectious. If CT abdomen pelvis with contrast is negative for ischemic colitis or diverticulitis can consider GI pathogen panel. No anemia at this time    LOS: 0 days   Vladimir Crofts  PA-C 06/03/2021, 10:45 AM  Contact #  [phone]

## 2021-06-03 NOTE — H&P (Signed)
History and Physical    Laura Haas [id number] DOB: 1945-10-08 DOA: 06/03/2021  PCP: Josetta Huddle, MD  Patient coming from: home  Chief Complaint: diarrhea, rectal bleeding  HPI: Laura Haas is a 75 y.o. female with medical history significant of osteoarthritis, HTN. Presenting with diarrhea and rectal bleeding. She reports that yesterday she had an acute onset of abdominal cramping. This led to a large amount of diarrhea. She had multiple episodes through the evening that eventually turned to BRBPR. As of this morning, her BMs are mostly blood. During these episodes, she felt diaphoretic. She denied any fevers or chills. She went to see her GI doc today and it was recommended that she come to the ED. She denies any other aggravating or alleviating factors.    ED Course: [facility] was consulted. She was started on abx, fluids. TRH was called for admission.  Review of Systems:  Denies CP, dyspnea, palpitations, lightheadedness, dizziness, vomiting, fever, syncopal episodes. Reports nausea. Review of systems is otherwise negative for all not mentioned in HPI.   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   Shortness of breath    Sinus infection    3 weeks ago   Vitamin D deficiency     PSHx Past Surgical History:  Procedure Laterality Date   ACHILLES TENDON SURGERY  09/06/2018   [facility] Procedure   BREAST BIOPSY Right yrs ago   benign   BREAST EXCISIONAL BIOPSY Right years ago   benign   catheter ablation of heart   20000   COLONOSCOPY WITH PROPOFOL N/A 05/21/2013   Procedure: COLONOSCOPY WITH PROPOFOL;   Surgeon: Garlan Fair, MD;  Location: [facility];  Service: Endoscopy;  Laterality: N/A;   EYE SURGERY     left catract 1996   NECK SURGERY  age 77   surgery on neck blood vessels   TUBAL LIGATION  yrs ago    Soc[facility]x  reports that she quit smoking about 54 years ago. Her smoking use included cigarettes. She has never used smokeless tobacco. She reports current alcohol use of about 7.0 standard drinks per week. She reports that she does not use drugs.  Allergies  Allergen Reactions   Allegra [Fexofenadine Hcl] Other (See Comments)    Increased liver function tests   Dye Fdc Red [Red Dye] Other (See Comments)    Passed out years ago   Fexofenadine Other (See Comments)    elevalted liver functions    Lisinopril Cough   Pravastatin     Muscle pain    FamHx Family History  Problem Relation Age of Onset   Diverticulitis Father     Prior to Admission medications   Medication Sig Start Date End Date Taking? Authorizing Provider  carboxymethylcellulose (REFRESH PLUS) 0.5 % SOLN Place 1 drop into both eyes daily as needed (dry eyes).   Yes [provider]  diclofenac Sodium (VOLTAREN) 1 % GEL Apply 2 g topically 4 (four) times daily.   Yes  [provider]  hydroxychloroquine (PLAQUENIL) 200 MG tablet Take 200 mg by mouth daily.   Yes [provider]  MAGNESIUM PO Take 200 mg by mouth daily.   Yes [provider]  meclizine (ANTIVERT) 25 MG tablet Take 25 mg by mouth 3 (three) times daily as needed for dizziness.   Yes [provider]  naproxen sodium (ALEVE) 220 MG tablet Take 220 mg by mouth daily as needed (pain).   Yes [provider]  Probiotic Product (PROBIOTIC-10 PO) Take 1 tablet by mouth daily. Healthy Heart Probiotic   Yes [provider]  telmisartan (MICARDIS) 80 MG tablet Take 80 mg by mouth daily. 04/20/17  Yes [provider]  Vitamin D, Ergocalciferol, (DRISDOL) 1.25 MG (50000 UNIT) CAPS capsule  Take 1 capsule (50,000 Units total) by mouth every 7 (seven) days. 04/13/21  Yes Laqueta Linden, MD  Wheat Dextrin (BENEFIBER PO) Take 30 mLs by mouth in the morning and at bedtime. Mix with juice or water   Yes [provider]  zolpidem (AMBIEN) 5 MG tablet Take 5 mg by mouth at bedtime as needed for sleep.   Yes [provider]    Physical Exam: Vitals:   06/03/21 1028 06/03/21 1124 06/03/21 1131 06/03/21 1200  BP: (!) 152/73 139/88 139/88 (!) 153/76  Pulse: 92 78 92 77  Resp: '18 18 18 18  '$ Temp:      TempSrc:      SpO2: 99% 97% 97% 96%  Weight:      Height:        General: 75 y.o. female resting in bed in NAD Eyes: PERRL, normal sclera ENMT: Nares patent w/o discharge, orophaynx clear, dentition normal, ears w/o discharge/lesions/ulcers Neck: Supple, trachea midline Cardiovascular: RRR, +S1, S2, no m/g/r, equal pulses throughout Respiratory: CTABL, no w/r/r, normal WOB GI: BS+, ND, mild global TTP, no masses noted, no organomegaly noted MSK: No e/c/c Skin: No rashes, bruises, ulcerations noted Neuro: A&O x 3, no focal deficits Psyc: Appropriate interaction and affect, calm/cooperative  Labs on Admission: I have personally reviewed following labs and imaging studies  CBC: Recent Labs  Lab 06/03/21 0947  WBC 12.9*  HGB 13.9  HCT 41.0  MCV 92.1  PLT 99991111   Basic Metabolic Panel: Recent Labs  Lab 06/03/21 0947  NA 137  K 4.2  CL 106  CO2 22  GLUCOSE 150*  BUN 18  CREATININE 1.19*  CALCIUM 9.6   GFR: Estimated Creatinine Clearance: 44.6 mL/min (A) (by C-G formula based on SCr of 1.19 mg/dL (H)). Liver Function Tests: Recent Labs  Lab 06/03/21 0947  AST 35  ALT 44  ALKPHOS 113  BILITOT 1.0  PROT 7.2  ALBUMIN 4.4   Recent Labs  Lab 06/03/21 0947  LIPASE 29   No results for input(s): AMMONIA in the last 168 hours. Coagulation Profile: No results for input(s): INR, PROTIME in the last 168 hours. Cardiac Enzymes: No results  for input(s): CKTOTAL, CKMB, CKMBINDEX, TROPONINI in the last 168 hours. BNP (last 3 results) No results for input(s): PROBNP in the last 8760 hours. HbA1C: No results for input(s): HGBA1C in the last 72 hours. CBG: No results for input(s): GLUCAP in the last 168 hours. Lipid Profile: No results for input(s): CHOL, HDL, LDLCALC, TRIG, CHOLHDL, LDLDIRECT in the last 72 hours. Thyroid Function Tests: No results for input(s): TSH, T4TOTAL, FREET4, T3FREE, THYROIDAB in the last 72 hours. Anemia Panel: No results for input(s): VITAMINB12, FOLATE, FERRITIN, TIBC, IRON, RETICCTPCT  in the last 72 hours. Urine analysis: No results found for: COLORURINE, APPEARANCEUR, LABSPEC, [place], GLUCOSEU, HGBUR, BILIRUBINUR, KETONESUR, PROTEINUR, UROBILINOGEN, NITRITE, LEUKOCYTESUR  Radiological Exams on Admission: CT Abdomen Pelvis W Contrast  Result Date: 06/03/2021 CLINICAL DATA:  Abdominal abscess/infection suspected. Nausea, vomiting, diarrhea. Abdominal cramping. EXAM: CT ABDOMEN AND PELVIS WITH CONTRAST TECHNIQUE: Multidetector CT imaging of the abdomen and pelvis was performed using the standard protocol following bolus administration of intravenous contrast. CONTRAST:  52m OMNIPAQUE IOHEXOL 350 MG/ML SOLN COMPARISON:  None. FINDINGS: Lower chest: Lung bases are clear. No effusions. Heart is normal size. Hepatobiliary: No focal hepatic abnormality. Gallbladder unremarkable. Pancreas: No focal abnormality or ductal dilatation. Spleen: No focal abnormality.  Normal size. Adrenals/Urinary Tract: No adrenal abnormality. No focal renal abnormality. No stones or hydronephrosis. Urinary bladder is unremarkable. Stomach/Bowel: Scattered colonic diverticulosis. There is wall thickening and surrounding inflammation involving the colon from the splenic flexure through the descending colon and into the proximal sigmoid colon compatible with colitis. No bowel obstruction. Stomach and small bowel decompressed,  unremarkable. Vascular/Lymphatic: Aortic atherosclerosis. No evidence of aneurysm or adenopathy. Reproductive: Uterus and adnexa unremarkable.  No mass. Other: Small amount of free fluid in the pelvis.  No free air. Musculoskeletal: No acute bony abnormality. IMPRESSION: Wall thickening and inflammation involving the left colon from the splenic flexure to the proximal sigmoid colon compatible with colitis. Scattered colonic diverticulosis. Small amount of free fluid in the pelvis. Electronically Signed   By: KRolm BaptiseM.D.   On: 06/03/2021 12:06    EKG: None obtained in ED  Assessment/Plan Rectal bleeding     - place in obs, tele     - [facility] onboard: rec IV cipro/flagyl, IVF, CLD; concern for ischemic colitis; appreciate assistance     - c diff, GI PCR ordered     - Hgb is stable, check q8h H&H  Diarrhea      - c diff, GI PCR ordered; see above  HTN    - continue home regimen  Erosive Osteoarthritis     - continue plaquenil   Hyperglycemia     - no history of DM     - check A1c  DVT prophylaxis: SCDs  Code Status: FULL  Family Communication: w/ husband at bedside  Consults called: [facility]   Status is: Observation  The patient remains OBS appropriate and will d/c before 2 midnights.  Dispo: The patient is from: Home              Anticipated d/c is to: Home              Patient currently is not medically stable to d/c.   Difficult to place patient No  Time spent coordinating admission: 60 minutes  [facility]  If 7PM-7AM, please contact night-coverage [email or web address]  06/03/2021, 12:48 PM

## 2021-06-03 NOTE — ED Notes (Signed)
Pt ambulatory without assistance.  

## 2021-06-03 NOTE — ED Triage Notes (Signed)
Patient reports that she had N/v/D and abdominal cramping. Patient states she had been having frequent diarrheal stools with blood during the might.

## 2021-06-03 NOTE — Plan of Care (Signed)
Care plan initiated.

## 2021-06-04 DIAGNOSIS — K529 Noninfective gastroenteritis and colitis, unspecified: Secondary | ICD-10-CM | POA: Diagnosis not present

## 2021-06-04 DIAGNOSIS — R1030 Lower abdominal pain, unspecified: Secondary | ICD-10-CM | POA: Diagnosis not present

## 2021-06-04 DIAGNOSIS — K921 Melena: Secondary | ICD-10-CM | POA: Diagnosis not present

## 2021-06-04 DIAGNOSIS — K922 Gastrointestinal hemorrhage, unspecified: Secondary | ICD-10-CM | POA: Diagnosis not present

## 2021-06-04 LAB — CBC
HCT: 35.1 % — ABNORMAL LOW (ref 36.0–46.0)
Hemoglobin: 11.9 g/dL — ABNORMAL LOW (ref 12.0–15.0)
MCH: 31.6 pg (ref 26.0–34.0)
MCHC: 33.9 g/dL (ref 30.0–36.0)
MCV: 93.1 fL (ref 80.0–100.0)
Platelets: 159 10*3/uL (ref 150–400)
RBC: 3.77 MIL/uL — ABNORMAL LOW (ref 3.87–5.11)
RDW: 13.4 % (ref 11.5–15.5)
WBC: 12 10*3/uL — ABNORMAL HIGH (ref 4.0–10.5)
nRBC: 0 % (ref 0.0–0.2)

## 2021-06-04 LAB — COMPREHENSIVE METABOLIC PANEL
ALT: 38 U/L (ref 0–44)
AST: 35 U/L (ref 15–41)
Albumin: 3.6 g/dL (ref 3.5–5.0)
Alkaline Phosphatase: 95 U/L (ref 38–126)
Anion gap: 8 (ref 5–15)
BUN: 11 mg/dL (ref 8–23)
CO2: 25 mmol/L (ref 22–32)
Calcium: 8.5 mg/dL — ABNORMAL LOW (ref 8.9–10.3)
Chloride: 102 mmol/L (ref 98–111)
Creatinine, Ser: 1.13 mg/dL — ABNORMAL HIGH (ref 0.44–1.00)
GFR, Estimated: 51 mL/min — ABNORMAL LOW (ref >60)
Glucose, Bld: 123 mg/dL — ABNORMAL HIGH (ref 70–99)
Potassium: 3.7 mmol/L (ref 3.5–5.1)
Sodium: 135 mmol/L (ref 135–145)
Total Bilirubin: 1 mg/dL (ref 0.3–1.2)
Total Protein: 6.2 g/dL — ABNORMAL LOW (ref 6.5–8.1)

## 2021-06-04 LAB — HEMOGLOBIN A1C
Hgb A1c MFr Bld: 5.4 % (ref 4.8–5.6)
Mean Plasma Glucose: 108.28 mg/dL

## 2021-06-04 MED ORDER — AMOXICILLIN-POT CLAVULANATE 875-125 MG PO TABS: 1.0000 | ORAL_TABLET | Freq: Two times a day (BID) | ORAL | 0 refills | Status: AC

## 2021-06-04 NOTE — Progress Notes (Signed)
Subjective: Patient complains of heartburn that started at 9 PM yesterday after she took her magnesium hydroxychloroquine and vitamin B12 tablets. Due to heartburn she did not have a good sleep overnight, however after having chicken soup in the morning she was able to sleep for 2 hours. She has marked improvement in abdominal cramping. Has not had any bowel movement since admission.  Objective: Vital signs in last 24 hours: Pulse Rate:  [77-92] 77 (09/15 1430) Resp:  [18] 18 (09/15 1430) BP: (139-155)/(59-88) 155/59 (09/15 1430) SpO2:  [94 %-99 %] 94 % (09/15 1430) Weight change:  Last BM Date: 06/03/21  PE: Appears comfortable, moist mucous membrane GENERAL: Able to speak in full sentences, cheerful  ABDOMEN: Soft, NABS, mild suprapubic tenderness, nondistended EXTREMITIES: No edema, no deformity  Lab Results: Results for orders placed or performed during the hospital encounter of 06/03/21 (from the past 48 hour(s))  Comprehensive metabolic panel     Status: Abnormal   Collection Time: 06/03/21  9:47 AM  Result Value Ref Range   Sodium 137 135 - 145 mmol/L   Potassium 4.2 3.5 - 5.1 mmol/L   Chloride 106 98 - 111 mmol/L   CO2 22 22 - 32 mmol/L   Glucose, Bld 150 (H) 70 - 99 mg/dL    Comment: Glucose reference range applies only to samples taken after fasting for at least 8 hours.   BUN 18 8 - 23 mg/dL   Creatinine, Ser 1.19 (H) 0.44 - 1.00 mg/dL   Calcium 9.6 8.9 - 10.3 mg/dL   Total Protein 7.2 6.5 - 8.1 g/dL   Albumin 4.4 3.5 - 5.0 g/dL   AST 35 15 - 41 U/L   ALT 44 0 - 44 U/L   Alkaline Phosphatase 113 38 - 126 U/L   Total Bilirubin 1.0 0.3 - 1.2 mg/dL   GFR, Estimated 48 (L) >60 mL/min    Comment: (NOTE) Calculated using the CKD-EPI Creatinine Equation (2021)    Anion gap 9 5 - 15    Comment: Performed at [facility], [place] [address], [place], [place] [address]  CBC     Status: Abnormal   Collection Time: 06/03/21  9:47 AM  Result Value Ref  Range   WBC 12.9 (H) 4.0 - 10.5 K/uL   RBC 4.45 3.87 - 5.11 MIL/uL   Hemoglobin 13.9 12.0 - 15.0 g/dL   HCT 41.0 36.0 - 46.0 %   MCV 92.1 80.0 - 100.0 fL   MCH 31.2 26.0 - 34.0 pg   MCHC 33.9 30.0 - 36.0 g/dL   RDW 13.2 11.5 - 15.5 %   Platelets 191 150 - 400 K/uL   nRBC 0.0 0.0 - 0.2 %    Comment: Performed at [facility], [place] [address], [place], [place] [address]  Type and screen [place]     Status: None   Collection Time: 06/03/21  9:47 AM  Result Value Ref Range   ABO/RH(D) O POS    Antibody Screen NEG    Sample Expiration      06/06/2021,2359 Performed at [facility], [place] [address] [place], [place] [address]   Lipase, blood     Status: None   Collection Time: 06/03/21  9:47 AM  Result Value Ref Range   Lipase 29 11 - 51 U/L    Comment: Performed at [facility], [place] [address] [place], [place] [address]  Resp Panel by RT-PCR (Flu A&B, Covid) Nasopharyngeal Swab  Status: None   Collection Time: 06/03/21  2:26 PM   Specimen: Nasopharyngeal Swab; Nasopharyngeal(NP) swabs in vial transport medium  Result Value Ref Range   SARS Coronavirus 2 by RT PCR NEGATIVE NEGATIVE    Comment: (NOTE) SARS-CoV-2 target nucleic acids are NOT DETECTED.  The SARS-CoV-2 RNA is generally detectable in upper respiratory specimens during the acute phase of infection. The lowest concentration of SARS-CoV-2 viral copies this assay can detect is 138 copies/mL. A negative result does not preclude SARS-Cov-2 infection and should not be used as the sole basis for treatment or other patient management decisions. A negative result may occur with  improper specimen collection/handling, submission of specimen other than nasopharyngeal swab, presence of viral mutation(s) within the areas targeted by this assay, and inadequate number of viral copies(<138 copies/mL). A negative result must be combined with clinical  observations, patient history, and epidemiological information. The expected result is Negative.  Fact Sheet for Patients:  EntrepreneurPulse.com.au  Fact Sheet for Healthcare Providers:  IncredibleEmployment.be  This test is no t yet approved or cleared by the [place] FDA and  has been authorized for detection and/or diagnosis of SARS-CoV-2 by FDA under an Emergency Use Authorization (EUA). This EUA will remain  in effect (meaning this test can be used) for the duration of the COVID-19 declaration under Section 564(b)(1) of the Act, 21 U.S.C.section 360bbb-3(b)(1), unless the authorization is terminated  or revoked sooner.       Influenza A by PCR NEGATIVE NEGATIVE   Influenza B by PCR NEGATIVE NEGATIVE    Comment: (NOTE) The Xpert Xpress SARS-CoV-2/FLU/RSV plus assay is intended as an aid in the diagnosis of influenza from Nasopharyngeal swab specimens and should not be used as a sole basis for treatment. Nasal washings and aspirates are unacceptable for Xpert Xpress SARS-CoV-2/FLU/RSV testing.  Fact Sheet for Patients: EntrepreneurPulse.com.au  Fact Sheet for Healthcare Providers: IncredibleEmployment.be  This test is not yet approved or cleared by the [place] FDA and has been authorized for detection and/or diagnosis of SARS-CoV-2 by FDA under an Emergency Use Authorization (EUA). This EUA will remain in effect (meaning this test can be used) for the duration of the COVID-19 declaration under Section 564(b)(1) of the Act, 21 U.S.C. section 360bbb-3(b)(1), unless the authorization is terminated or revoked.  Performed at [facility], [place] [address] [place], [place] [address]   ABO/Rh     Status: None   Collection Time: 06/03/21  5:39 PM  Result Value Ref Range   ABO/RH(D)      O POS Performed at [facility], [place] [address], [place], [place]  [address]   Hemoglobin A1c     Status: None   Collection Time: 06/03/21  5:39 PM  Result Value Ref Range   Hgb A1c MFr Bld 5.4 4.8 - 5.6 %    Comment: (NOTE) Pre diabetes:          5.7%-6.4%  Diabetes:              >6.4%  Glycemic control for   <7.0% adults with diabetes    Mean Plasma Glucose 108.28 mg/dL    Comment: Performed at [place] [address] [place],  [address]  Hemoglobin and hematocrit, blood     Status: None   Collection Time: 06/03/21  5:39 PM  Result Value Ref Range   Hemoglobin 13.3 12.0 - 15.0 g/dL   HCT 39.5 36.0 - 46.0 %    Comment: Performed  at [facility], [place] [address] [place], [place] [address]  Comprehensive metabolic panel     Status: Abnormal   Collection Time: 06/04/21 12:27 AM  Result Value Ref Range   Sodium 135 135 - 145 mmol/L   Potassium 3.7 3.5 - 5.1 mmol/L   Chloride 102 98 - 111 mmol/L   CO2 25 22 - 32 mmol/L   Glucose, Bld 123 (H) 70 - 99 mg/dL    Comment: Glucose reference range applies only to samples taken after fasting for at least 8 hours.   BUN 11 8 - 23 mg/dL   Creatinine, Ser 1.13 (H) 0.44 - 1.00 mg/dL   Calcium 8.5 (L) 8.9 - 10.3 mg/dL   Total Protein 6.2 (L) 6.5 - 8.1 g/dL   Albumin 3.6 3.5 - 5.0 g/dL   AST 35 15 - 41 U/L   ALT 38 0 - 44 U/L   Alkaline Phosphatase 95 38 - 126 U/L   Total Bilirubin 1.0 0.3 - 1.2 mg/dL   GFR, Estimated 51 (L) >60 mL/min    Comment: (NOTE) Calculated using the CKD-EPI Creatinine Equation (2021)    Anion gap 8 5 - 15    Comment: Performed at [facility], [place] [address], [place], [place] [address]  CBC     Status: Abnormal   Collection Time: 06/04/21 12:27 AM  Result Value Ref Range   WBC 12.0 (H) 4.0 - 10.5 K/uL   RBC 3.77 (L) 3.87 - 5.11 MIL/uL   Hemoglobin 11.9 (L) 12.0 - 15.0 g/dL   HCT 35.1 (L) 36.0 - 46.0 %   MCV 93.1 80.0 - 100.0 fL   MCH 31.6 26.0 - 34.0 pg   MCHC 33.9 30.0 - 36.0 g/dL   RDW 13.4 11.5 - 15.5 %    Platelets 159 150 - 400 K/uL   nRBC 0.0 0.0 - 0.2 %    Comment: Performed at [facility], [place] [address], [place], [place] [address]    Studies/Results: CT Abdomen Pelvis W Contrast  Result Date: 06/03/2021 CLINICAL DATA:  Abdominal abscess/infection suspected. Nausea, vomiting, diarrhea. Abdominal cramping. EXAM: CT ABDOMEN AND PELVIS WITH CONTRAST TECHNIQUE: Multidetector CT imaging of the abdomen and pelvis was performed using the standard protocol following bolus administration of intravenous contrast. CONTRAST:  49m OMNIPAQUE IOHEXOL 350 MG/ML SOLN COMPARISON:  None. FINDINGS: Lower chest: Lung bases are clear. No effusions. Heart is normal size. Hepatobiliary: No focal hepatic abnormality. Gallbladder unremarkable. Pancreas: No focal abnormality or ductal dilatation. Spleen: No focal abnormality.  Normal size. Adrenals/Urinary Tract: No adrenal abnormality. No focal renal abnormality. No stones or hydronephrosis. Urinary bladder is unremarkable. Stomach/Bowel: Scattered colonic diverticulosis. There is wall thickening and surrounding inflammation involving the colon from the splenic flexure through the descending colon and into the proximal sigmoid colon compatible with colitis. No bowel obstruction. Stomach and small bowel decompressed, unremarkable. Vascular/Lymphatic: Aortic atherosclerosis. No evidence of aneurysm or adenopathy. Reproductive: Uterus and adnexa unremarkable.  No mass. Other: Small amount of free fluid in the pelvis.  No free air. Musculoskeletal: No acute bony abnormality. IMPRESSION: Wall thickening and inflammation involving the left colon from the splenic flexure to the proximal sigmoid colon compatible with colitis. Scattered colonic diverticulosis. Small amount of free fluid in the pelvis. Electronically Signed   By: KRolm BaptiseM.D.   On: 06/03/2021 12:06    Medications: I have reviewed the patient's current medications.  Assessment: Abdominal pain,  rectal bleeding, CAT scan finding of wall  thickening and inflammation involving left colon from splenic flexure to proximal sigmoid colon compatible with colitis, clinical picture suspicious for ischemic colitis WBC count 12, hemoglobin 11.9(has been receiving IV fluids at 125 mL/h), normal electrolytes, creatinine 1.23 and baseline, GFR 51  Plan: Advance diet to regular/soft. Patient unable to give stool sample for testing for C. difficile and GI pathogen panel. Most patients with ischemic colitis improve in 1 to 2 days and have complete resolution within 1 to 2 weeks. If patient able to tolerate diet, okay to DC home today, within empiric antibiotics, either Augmentin 875/125 twice daily for 7 days, or Cipro 500 mg twice a day and Flagyl 250 mg 3 times a day for 7 days. Patient has an outpatient colonoscopy scheduled with me on 06/17/2021.   Ronnette Juniper, MD 06/04/2021, 9:28 AM

## 2021-06-04 NOTE — Discharge Summary (Signed)
Physician Discharge Summary  Laura Haas [id number] DOB: [birth year] DOA: 06/03/2021  PCP: Josetta Huddle, MD  Admit date: 06/03/2021 Discharge date: 06/04/2021  Admitted From: Home Disposition: Home  Recommendations for Outpatient Follow-up:  Follow up with PCP in 1-2 weeks Please obtain BMP/CBC in one week Please follow up with GI as scheduled  Home Health: None Equipment/Devices: None  Discharge Condition: Stable CODE STATUS: Full Diet recommendation: Soft bland diet  Brief/Interim Summary: Laura Haas is a 75 y.o. female with medical history significant of osteoarthritis, HTN. Presenting with diarrhea and rectal bleeding. She reports that yesterday she had an acute onset of abdominal cramping. This led to a large amount of diarrhea. She had multiple episodes through the evening that eventually turned to BRBPR.   GI consulted, resolution of bright red blood per rectum in the setting of presumed ischemic colitis, continue Augmentin for 7 days, follow-up outpatient for previously scheduled scope otherwise no acute issues ongoing tolerating p.o. well without any further evidence of bright red blood per rectum hemoglobin stable otherwise agreeable for discharge with close outpatient follow-up with PCP and GI as scheduled.    Discharge Diagnoses:  Active Problems:   GIB (gastrointestinal bleeding)    Discharge Instructions  Discharge Instructions     Call MD for:  severe uncontrolled pain   Complete by: As directed    Diet - low sodium heart healthy   Complete by: As directed    Increase activity slowly   Complete by: As directed       Allergies as of 06/04/2021       Reactions   Allegra [fexofenadine Hcl] Other (See Comments)   Increased liver function tests   Fexofenadine Other (See Comments)   elevalted liver functions    Lisinopril Cough   Pravastatin    Muscle pain        Medication List     STOP taking these medications    naproxen sodium 220  MG tablet Commonly known as: ALEVE       TAKE these medications    amoxicillin-clavulanate 875-125 MG tablet Commonly known as: Augmentin Take 1 tablet by mouth 2 (two) times daily for 7 days.   BENEFIBER PO Take 30 mLs by mouth in the morning and at bedtime. Mix with juice or water   carboxymethylcellulose 0.5 % Soln Commonly known as: REFRESH PLUS Place 1 drop into both eyes daily as needed (dry eyes).   diclofenac Sodium 1 % Gel Commonly known as: VOLTAREN Apply 2 g topically 4 (four) times daily.   hydroxychloroquine 200 MG tablet Commonly known as: PLAQUENIL Take 200 mg by mouth daily.   MAGNESIUM PO Take 200 mg by mouth daily.   meclizine 25 MG tablet Commonly known as: ANTIVERT Take 25 mg by mouth 3 (three) times daily as needed for dizziness.   PROBIOTIC-10 PO Take 1 tablet by mouth daily. Healthy Heart Probiotic   telmisartan 80 MG tablet Commonly known as: MICARDIS Take 80 mg by mouth daily.   Vitamin D (Ergocalciferol) 1.25 MG (50000 UNIT) Caps capsule Commonly known as: DRISDOL Take 1 capsule (50,000 Units total) by mouth every 7 (seven) days.   zolpidem 5 MG tablet Commonly known as: AMBIEN Take 5 mg by mouth at bedtime as needed for sleep.        Allergies  Allergen Reactions   Allegra [Fexofenadine Hcl] Other (See Comments)    Increased liver function tests   Fexofenadine Other (See Comments)    elevalted liver functions  Lisinopril Cough   Pravastatin     Muscle pain    Consultations: GI  Procedures/Studies: CT Abdomen Pelvis W Contrast  Result Date: 06/03/2021 CLINICAL DATA:  Abdominal abscess/infection suspected. Nausea, vomiting, diarrhea. Abdominal cramping. EXAM: CT ABDOMEN AND PELVIS WITH CONTRAST TECHNIQUE: Multidetector CT imaging of the abdomen and pelvis was performed using the standard protocol following bolus administration of intravenous contrast. CONTRAST:  36m OMNIPAQUE IOHEXOL 350 MG/ML SOLN COMPARISON:  None.  FINDINGS: Lower chest: Lung bases are clear. No effusions. Heart is normal size. Hepatobiliary: No focal hepatic abnormality. Gallbladder unremarkable. Pancreas: No focal abnormality or ductal dilatation. Spleen: No focal abnormality.  Normal size. Adrenals/Urinary Tract: No adrenal abnormality. No focal renal abnormality. No stones or hydronephrosis. Urinary bladder is unremarkable. Stomach/Bowel: Scattered colonic diverticulosis. There is wall thickening and surrounding inflammation involving the colon from the splenic flexure through the descending colon and into the proximal sigmoid colon compatible with colitis. No bowel obstruction. Stomach and small bowel decompressed, unremarkable. Vascular/Lymphatic: Aortic atherosclerosis. No evidence of aneurysm or adenopathy. Reproductive: Uterus and adnexa unremarkable.  No mass. Other: Small amount of free fluid in the pelvis.  No free air. Musculoskeletal: No acute bony abnormality. IMPRESSION: Wall thickening and inflammation involving the left colon from the splenic flexure to the proximal sigmoid colon compatible with colitis. Scattered colonic diverticulosis. Small amount of free fluid in the pelvis. Electronically Signed   By: KRolm BaptiseM.D.   On: 06/03/2021 12:06     Subjective: No acute issues or events overnight tolerating p.o. well requesting discharge home   Discharge Exam: Vitals:   06/03/21 1200 06/03/21 1430  BP: (!) 153/76 (!) 155/59  Pulse: 77 77  Resp: 18 18  Temp:    SpO2: 96% 94%   Vitals:   06/03/21 1124 06/03/21 1131 06/03/21 1200 06/03/21 1430  BP: 139/88 139/88 (!) 153/76 (!) 155/59  Pulse: 78 92 77 77  Resp: '18 18 18 18  '$ Temp:      TempSrc:      SpO2: 97% 97% 96% 94%  Weight:      Height:        General: Pt is alert, awake, not in acute distress Cardiovascular: RRR, S1/S2 +, no rubs, no gallops Respiratory: CTA bilaterally, no wheezing, no rhonchi Abdominal: Soft, NT, ND, bowel sounds + Extremities: no edema, no  cyanosis    The results of significant diagnostics from this hospitalization (including imaging, microbiology, ancillary and laboratory) are listed below for reference.     Microbiology: Recent Results (from the past 240 hour(s))  Resp Panel by RT-PCR (Flu A&B, Covid) Nasopharyngeal Swab     Status: None   Collection Time: 06/03/21  2:26 PM   Specimen: Nasopharyngeal Swab; Nasopharyngeal(NP) swabs in vial transport medium  Result Value Ref Range Status   SARS Coronavirus 2 by RT PCR NEGATIVE NEGATIVE Final    Comment: (NOTE) SARS-CoV-2 target nucleic acids are NOT DETECTED.  The SARS-CoV-2 RNA is generally detectable in upper respiratory specimens during the acute phase of infection. The lowest concentration of SARS-CoV-2 viral copies this assay can detect is 138 copies/mL. A negative result does not preclude SARS-Cov-2 infection and should not be used as the sole basis for treatment or other patient management decisions. A negative result may occur with  improper specimen collection/handling, submission of specimen other than nasopharyngeal swab, presence of viral mutation(s) within the areas targeted by this assay, and inadequate number of viral copies(<138 copies/mL). A negative result must be combined with clinical observations,  patient history, and epidemiological information. The expected result is Negative.  Fact Sheet for Patients:  EntrepreneurPulse.com.au  Fact Sheet for Healthcare Providers:  IncredibleEmployment.be  This test is no t yet approved or cleared by the [place] FDA and  has been authorized for detection and/or diagnosis of SARS-CoV-2 by FDA under an Emergency Use Authorization (EUA). This EUA will remain  in effect (meaning this test can be used) for the duration of the COVID-19 declaration under Section 564(b)(1) of the Act, 21 U.S.C.section 360bbb-3(b)(1), unless the authorization is terminated  or revoked  sooner.       Influenza A by PCR NEGATIVE NEGATIVE Final   Influenza B by PCR NEGATIVE NEGATIVE Final    Comment: (NOTE) The Xpert Xpress SARS-CoV-2/FLU/RSV plus assay is intended as an aid in the diagnosis of influenza from Nasopharyngeal swab specimens and should not be used as a sole basis for treatment. Nasal washings and aspirates are unacceptable for Xpert Xpress SARS-CoV-2/FLU/RSV testing.  Fact Sheet for Patients: EntrepreneurPulse.com.au  Fact Sheet for Healthcare Providers: IncredibleEmployment.be  This test is not yet approved or cleared by the [place] FDA and has been authorized for detection and/or diagnosis of SARS-CoV-2 by FDA under an Emergency Use Authorization (EUA). This EUA will remain in effect (meaning this test can be used) for the duration of the COVID-19 declaration under Section 564(b)(1) of the Act, 21 U.S.C. section 360bbb-3(b)(1), unless the authorization is terminated or revoked.  Performed at [facility], [place] [address] [place], [place] [address]      Labs: BNP (last 3 results) No results for input(s): BNP in the last 8760 hours. Basic Metabolic Panel: Recent Labs  Lab 06/03/21 0947 06/04/21 0027  NA 137 135  K 4.2 3.7  CL 106 102  CO2 22 25  GLUCOSE 150* 123*  BUN 18 11  CREATININE 1.19* 1.13*  CALCIUM 9.6 8.5*   Liver Function Tests: Recent Labs  Lab 06/03/21 0947 06/04/21 0027  AST 35 35  ALT 44 38  ALKPHOS 113 95  BILITOT 1.0 1.0  PROT 7.2 6.2*  ALBUMIN 4.4 3.6   Recent Labs  Lab 06/03/21 0947  LIPASE 29   No results for input(s): AMMONIA in the last 168 hours. CBC: Recent Labs  Lab 06/03/21 0947 06/03/21 1739 06/04/21 0027  WBC 12.9*  --  12.0*  HGB 13.9 13.3 11.9*  HCT 41.0 39.5 35.1*  MCV 92.1  --  93.1  PLT 191  --  159   Cardiac Enzymes: No results for input(s): CKTOTAL, CKMB, CKMBINDEX, TROPONINI in the last 168 hours. BNP: Invalid  input(s): POCBNP CBG: No results for input(s): GLUCAP in the last 168 hours. D-Dimer No results for input(s): DDIMER in the last 72 hours. Hgb A1c Recent Labs    06/03/21 1739  HGBA1C 5.4   Lipid Profile No results for input(s): CHOL, HDL, LDLCALC, TRIG, CHOLHDL, LDLDIRECT in the last 72 hours. Thyroid function studies No results for input(s): TSH, T4TOTAL, T3FREE, THYROIDAB in the last 72 hours.  Invalid input(s): FREET3 Anemia work up No results for input(s): VITAMINB12, FOLATE, FERRITIN, TIBC, IRON, RETICCTPCT in the last 72 hours. Urinalysis No results found for: COLORURINE, APPEARANCEUR, [place], [place], GLUCOSEU, [place], [place], [place], PROTEINUR, UROBILINOGEN, NITRITE, LEUKOCYTESUR Sepsis Labs Invalid input(s): PROCALCITONIN,  WBC,  LACTICIDVEN Microbiology Recent Results (from the past 240 hour(s))  Resp Panel by RT-PCR (Flu A&B, Covid) Nasopharyngeal Swab     Status: None   Collection Time: 06/03/21  2:26 PM  Specimen: Nasopharyngeal Swab; Nasopharyngeal(NP) swabs in vial transport medium  Result Value Ref Range Status   SARS Coronavirus 2 by RT PCR NEGATIVE NEGATIVE Final    Comment: (NOTE) SARS-CoV-2 target nucleic acids are NOT DETECTED.  The SARS-CoV-2 RNA is generally detectable in upper respiratory specimens during the acute phase of infection. The lowest concentration of SARS-CoV-2 viral copies this assay can detect is 138 copies/mL. A negative result does not preclude SARS-Cov-2 infection and should not be used as the sole basis for treatment or other patient management decisions. A negative result may occur with  improper specimen collection/handling, submission of specimen other than nasopharyngeal swab, presence of viral mutation(s) within the areas targeted by this assay, and inadequate number of viral copies(<138 copies/mL). A negative result must be combined with clinical observations, patient history, and epidemiological information. The  expected result is Negative.  Fact Sheet for Patients:  EntrepreneurPulse.com.au  Fact Sheet for Healthcare Providers:  IncredibleEmployment.be  This test is no t yet approved or cleared by the [place] FDA and  has been authorized for detection and/or diagnosis of SARS-CoV-2 by FDA under an Emergency Use Authorization (EUA). This EUA will remain  in effect (meaning this test can be used) for the duration of the COVID-19 declaration under Section 564(b)(1) of the Act, 21 U.S.C.section 360bbb-3(b)(1), unless the authorization is terminated  or revoked sooner.       Influenza A by PCR NEGATIVE NEGATIVE Final   Influenza B by PCR NEGATIVE NEGATIVE Final    Comment: (NOTE) The Xpert Xpress SARS-CoV-2/FLU/RSV plus assay is intended as an aid in the diagnosis of influenza from Nasopharyngeal swab specimens and should not be used as a sole basis for treatment. Nasal washings and aspirates are unacceptable for Xpert Xpress SARS-CoV-2/FLU/RSV testing.  Fact Sheet for Patients: EntrepreneurPulse.com.au  Fact Sheet for Healthcare Providers: IncredibleEmployment.be  This test is not yet approved or cleared by the [place] FDA and has been authorized for detection and/or diagnosis of SARS-CoV-2 by FDA under an Emergency Use Authorization (EUA). This EUA will remain in effect (meaning this test can be used) for the duration of the COVID-19 declaration under Section 564(b)(1) of the Act, 21 U.S.C. section 360bbb-3(b)(1), unless the authorization is terminated or revoked.  Performed at [facility], [place] [address] [place], [place] [address]      Time coordinating discharge: Over 30 minutes  SIGNED:   Little Ishikawa, DO [facility] 06/04/2021, 10:13 AM Pager   If 7PM-7AM, please contact night-coverage [email or web address]

## 2021-06-06 ENCOUNTER — Other Ambulatory Visit (INDEPENDENT_AMBULATORY_CARE_PROVIDER_SITE_OTHER): Payer: Self-pay | Admitting: Family Medicine

## 2021-06-06 DIAGNOSIS — E559 Vitamin D deficiency, unspecified: Secondary | ICD-10-CM

## 2021-06-07 NOTE — Telephone Encounter (Signed)
Pt last seen by Dr. Ukleja.  

## 2021-06-08 ENCOUNTER — Encounter (INDEPENDENT_AMBULATORY_CARE_PROVIDER_SITE_OTHER): Payer: Self-pay | Admitting: Family Medicine

## 2021-06-08 NOTE — Telephone Encounter (Signed)
Please advise 

## 2021-06-09 DIAGNOSIS — E782 Mixed hyperlipidemia: Secondary | ICD-10-CM | POA: Diagnosis not present

## 2021-06-09 DIAGNOSIS — E7800 Pure hypercholesterolemia, unspecified: Secondary | ICD-10-CM | POA: Diagnosis not present

## 2021-06-09 DIAGNOSIS — I10 Essential (primary) hypertension: Secondary | ICD-10-CM | POA: Diagnosis not present

## 2021-06-09 DIAGNOSIS — G4700 Insomnia, unspecified: Secondary | ICD-10-CM | POA: Diagnosis not present

## 2021-06-10 DIAGNOSIS — K529 Noninfective gastroenteritis and colitis, unspecified: Secondary | ICD-10-CM | POA: Diagnosis not present

## 2021-06-11 ENCOUNTER — Ambulatory Visit
Admission: RE | Admit: 2021-06-11 | Discharge: 2021-06-11 | Disposition: A | Discharge status: Home / Self Care | Payer: Medicare Other | Source: Ambulatory Visit | Attending: Internal Medicine | Admitting: Internal Medicine

## 2021-06-11 ENCOUNTER — Other Ambulatory Visit: Payer: Self-pay

## 2021-06-11 DIAGNOSIS — Z1231 Encounter for screening mammogram for malignant neoplasm of breast: Secondary | ICD-10-CM

## 2021-06-11 MED ORDER — VITAMIN D (ERGOCALCIFEROL) 1.25 MG (50000 UNIT) PO CAPS: 50000.0000 [IU] | ORAL_CAPSULE | ORAL | 0 refills | Status: DC

## 2021-06-17 DIAGNOSIS — K5289 Other specified noninfective gastroenteritis and colitis: Secondary | ICD-10-CM | POA: Diagnosis not present

## 2021-06-17 DIAGNOSIS — R933 Abnormal findings on diagnostic imaging of other parts of digestive tract: Secondary | ICD-10-CM | POA: Diagnosis not present

## 2021-06-17 DIAGNOSIS — K648 Other hemorrhoids: Secondary | ICD-10-CM | POA: Diagnosis not present

## 2021-06-17 DIAGNOSIS — K5730 Diverticulosis of large intestine without perforation or abscess without bleeding: Secondary | ICD-10-CM | POA: Diagnosis not present

## 2021-06-17 DIAGNOSIS — K6389 Other specified diseases of intestine: Secondary | ICD-10-CM | POA: Diagnosis not present

## 2021-06-22 DIAGNOSIS — D649 Anemia, unspecified: Secondary | ICD-10-CM | POA: Diagnosis not present

## 2021-06-22 DIAGNOSIS — K5289 Other specified noninfective gastroenteritis and colitis: Secondary | ICD-10-CM | POA: Diagnosis not present

## 2021-06-22 DIAGNOSIS — K529 Noninfective gastroenteritis and colitis, unspecified: Secondary | ICD-10-CM | POA: Diagnosis not present

## 2021-06-23 ENCOUNTER — Other Ambulatory Visit: Payer: Self-pay

## 2021-06-23 ENCOUNTER — Encounter (INDEPENDENT_AMBULATORY_CARE_PROVIDER_SITE_OTHER): Payer: Self-pay | Admitting: Family Medicine

## 2021-06-23 ENCOUNTER — Ambulatory Visit (INDEPENDENT_AMBULATORY_CARE_PROVIDER_SITE_OTHER): Payer: Medicare Other | Admitting: Family Medicine

## 2021-06-23 VITALS — BP 123/77 | HR 76 | Temp 98.1°F | Ht 63.0 in | Wt 197.0 lb

## 2021-06-23 DIAGNOSIS — M2550 Pain in unspecified joint: Secondary | ICD-10-CM | POA: Diagnosis not present

## 2021-06-23 DIAGNOSIS — M154 Erosive (osteo)arthritis: Secondary | ICD-10-CM | POA: Diagnosis not present

## 2021-06-23 DIAGNOSIS — Z6838 Body mass index (BMI) 38.0-38.9, adult: Secondary | ICD-10-CM | POA: Diagnosis not present

## 2021-06-23 DIAGNOSIS — E559 Vitamin D deficiency, unspecified: Secondary | ICD-10-CM

## 2021-06-23 DIAGNOSIS — K529 Noninfective gastroenteritis and colitis, unspecified: Secondary | ICD-10-CM | POA: Diagnosis not present

## 2021-06-23 DIAGNOSIS — I10 Essential (primary) hypertension: Secondary | ICD-10-CM | POA: Diagnosis not present

## 2021-06-23 DIAGNOSIS — Z79899 Other long term (current) drug therapy: Secondary | ICD-10-CM | POA: Diagnosis not present

## 2021-06-23 DIAGNOSIS — M150 Primary generalized (osteo)arthritis: Secondary | ICD-10-CM | POA: Diagnosis not present

## 2021-06-23 NOTE — Progress Notes (Signed)
Chief Complaint:   OBESITY Aspasia is here to discuss her progress with her obesity treatment plan along with follow-up of her obesity related diagnoses. Cayla is on the Category 2 Plan and states she is following her eating plan approximately 0% of the time. Kyley states she is walking 3,000 steps 7 times a week.  Today's visit was #: 9 Starting weight: 215 lbs Starting date: 11/18/2020 Today's weight: 197 lbs Today's date: 06/23/2021 Total lbs lost to date: 18 Total lbs lost since last in-office visit: 8  Interim History: Kaylena is currently still on soft diet due to ischemic colitis. She had blood work just the other day showing improvement of Hgb/Hct. Her abdominal pain is gone. She had chicken thighs and roasted asparagus last night and is transitioning to high fiber diet.  Subjective:   1. Essential hypertension BP within normal limits. Pt denies chest pain/chest pressure/headache.  2. Vitamin D deficiency Olayinka is not taking supplements currently.  Assessment/Plan:   1. Essential hypertension Ozella is working on healthy weight loss and exercise to improve blood pressure control. We will watch for signs of hypotension as she continues her lifestyle modifications. Continue Micardis with no change in dose.  2. Vitamin D deficiency Low Vitamin D level contributes to fatigue and are associated with obesity, breast, and colon cancer. She will follow-up with labs in 3 months for routine testing of Vitamin D.  3. Obesity with current BMI of 35.0  Defne is currently in the action stage of change. As such, her goal is to continue with weight loss efforts. She has agreed to practicing portion control and making smarter food choices, such as increasing vegetables and decreasing simple carbohydrates.   Exercise goals:  As is  Behavioral modification strategies: increasing lean protein intake.  Milady has agreed to follow-up with our clinic in 3 weeks. She was informed of the  importance of frequent follow-up visits to maximize her success with intensive lifestyle modifications for her multiple health conditions.   Objective:   Blood pressure 123/77, pulse 76, temperature 98.1 F (36.7 C), height 5\' 3"  (1.6 m), weight 197 lb (89.4 kg), SpO2 98 %. Body mass index is 34.9 kg/m.  General: Cooperative, alert, well developed, in no acute distress. HEENT: Conjunctivae and lids unremarkable. Cardiovascular: Regular rhythm.  Lungs: Normal work of breathing. Neurologic: No focal deficits.   Lab Results  Component Value Date   CREATININE 1.13 (H) 06/04/2021   BUN 11 06/04/2021   NA 135 06/04/2021   K 3.7 06/04/2021   CL 102 06/04/2021   CO2 25 06/04/2021   Lab Results  Component Value Date   ALT 38 06/04/2021   AST 35 06/04/2021   ALKPHOS 95 06/04/2021   BILITOT 1.0 06/04/2021   Lab Results  Component Value Date   HGBA1C 5.4 06/03/2021   HGBA1C 5.6 05/05/2021   HGBA1C 5.5 11/18/2020   Lab Results  Component Value Date   INSULIN 6.9 05/05/2021   INSULIN 9.8 11/18/2020   Lab Results  Component Value Date   TSH 3.130 11/18/2020   Lab Results  Component Value Date   CHOL 234 (H) 05/05/2021   HDL 75 05/05/2021   LDLCALC 141 (H) 05/05/2021   TRIG 103 05/05/2021   Lab Results  Component Value Date   VD25OH 47.5 05/05/2021   VD25OH 35.7 11/18/2020   Lab Results  Component Value Date   WBC 12.0 (H) 06/04/2021   HGB 11.9 (L) 06/04/2021   HCT 35.1 (L) 06/04/2021  MCV 93.1 06/04/2021   PLT 159 06/04/2021    Attestation Statements:   Reviewed by clinician on day of visit: allergies, medications, problem list, medical history, surgical history, family history, social history, and previous encounter notes.  Time spent on visit including pre-visit chart review and post-visit care and charting was 15 minutes.   Coral Ceo, CMA, am acting as transcriptionist for [name], MD.   I have reviewed the above documentation for  accuracy and completeness, and I agree with the above. - [name], MD

## 2021-07-06 DIAGNOSIS — L918 Other hypertrophic disorders of the skin: Secondary | ICD-10-CM | POA: Diagnosis not present

## 2021-07-06 DIAGNOSIS — L821 Other seborrheic keratosis: Secondary | ICD-10-CM | POA: Diagnosis not present

## 2021-07-06 DIAGNOSIS — D2262 Melanocytic nevi of left upper limb, including shoulder: Secondary | ICD-10-CM | POA: Diagnosis not present

## 2021-07-06 DIAGNOSIS — Z85828 Personal history of other malignant neoplasm of skin: Secondary | ICD-10-CM | POA: Diagnosis not present

## 2021-07-06 DIAGNOSIS — L738 Other specified follicular disorders: Secondary | ICD-10-CM | POA: Diagnosis not present

## 2021-07-06 DIAGNOSIS — D1801 Hemangioma of skin and subcutaneous tissue: Secondary | ICD-10-CM | POA: Diagnosis not present

## 2021-07-08 DIAGNOSIS — H2511 Age-related nuclear cataract, right eye: Secondary | ICD-10-CM | POA: Diagnosis not present

## 2021-07-08 DIAGNOSIS — Z961 Presence of intraocular lens: Secondary | ICD-10-CM | POA: Diagnosis not present

## 2021-07-08 DIAGNOSIS — M154 Erosive (osteo)arthritis: Secondary | ICD-10-CM | POA: Diagnosis not present

## 2021-07-08 DIAGNOSIS — H04123 Dry eye syndrome of bilateral lacrimal glands: Secondary | ICD-10-CM | POA: Diagnosis not present

## 2021-07-08 DIAGNOSIS — H5213 Myopia, bilateral: Secondary | ICD-10-CM | POA: Diagnosis not present

## 2021-07-08 DIAGNOSIS — Z79899 Other long term (current) drug therapy: Secondary | ICD-10-CM | POA: Diagnosis not present

## 2021-07-08 DIAGNOSIS — H524 Presbyopia: Secondary | ICD-10-CM | POA: Diagnosis not present

## 2021-07-10 ENCOUNTER — Other Ambulatory Visit (INDEPENDENT_AMBULATORY_CARE_PROVIDER_SITE_OTHER): Payer: Self-pay | Admitting: Family Medicine

## 2021-07-10 DIAGNOSIS — E559 Vitamin D deficiency, unspecified: Secondary | ICD-10-CM

## 2021-07-12 NOTE — Telephone Encounter (Signed)
Dr.Ukleja 

## 2021-07-14 ENCOUNTER — Ambulatory Visit (INDEPENDENT_AMBULATORY_CARE_PROVIDER_SITE_OTHER): Payer: Medicare Other | Admitting: Family Medicine

## 2021-07-14 ENCOUNTER — Encounter (INDEPENDENT_AMBULATORY_CARE_PROVIDER_SITE_OTHER): Payer: Self-pay | Admitting: Family Medicine

## 2021-07-14 ENCOUNTER — Other Ambulatory Visit: Payer: Self-pay

## 2021-07-14 VITALS — BP 111/62 | HR 86 | Temp 97.8°F | Ht 63.0 in | Wt 199.0 lb

## 2021-07-14 DIAGNOSIS — Z6838 Body mass index (BMI) 38.0-38.9, adult: Secondary | ICD-10-CM

## 2021-07-14 DIAGNOSIS — I10 Essential (primary) hypertension: Secondary | ICD-10-CM

## 2021-07-14 DIAGNOSIS — K5900 Constipation, unspecified: Secondary | ICD-10-CM

## 2021-07-14 NOTE — Progress Notes (Signed)
Chief Complaint:   OBESITY Laura Haas is here to discuss her progress with her obesity treatment plan along with follow-up of her obesity related diagnoses. Laura Haas is on practicing portion control and making smarter food choices, such as increasing vegetables and decreasing simple carbohydrates and states she is following her eating plan approximately 60% of the time. Laura Haas states she is swimming and walking 60 minutes 2-5 times per week.  Today's visit was #: 10 Starting weight: 215 lbs Starting date: 11/18/2020 Today's weight: 199 lbs Today's date: 07/14/2021 Total lbs lost to date: 16 Total lbs lost since last in-office visit: 0  Interim History: Laura Haas is concerned about a possible UTI and constipation. She is doing an apple a day, vegetables, bread with fiber. She did eat out more frequently than she would have liked last week. Pt is experiencing dysuria (has had UTI in 2016). Besides eating out, she is following meal plan.  Subjective:   1. Constipation, unspecified constipation type Laura Haas reports a change in frequency of bowel movements as well as consistency of BM. She went to see GI for history of constipation.  2. Essential hypertension BP very well controlled. Pt denies chest pain/chest pressure/headache.  Assessment/Plan:   1. Constipation, unspecified constipation type Laura Haas is to reach out to GI for further management.  2. Essential hypertension Laura Haas is working on healthy weight loss and exercise to improve blood pressure control. We will watch for signs of hypotension as she continues her lifestyle modifications. Follow up BP at next appt. No change in medication doses today.  3. Obesity with current BMI of 35.3  Laura Haas is currently in the action stage of change. As such, her goal is to continue with weight loss efforts. She has agreed to the Category 2 Plan.   Exercise goals: All adults should avoid inactivity. Some physical activity is better than none, and  adults who participate in any amount of physical activity gain some health benefits.  Behavioral modification strategies: increasing lean protein intake, meal planning and cooking strategies, and keeping healthy foods in the home.  Laura Haas has agreed to follow-up with our clinic in 2-3 weeks. She was informed of the importance of frequent follow-up visits to maximize her success with intensive lifestyle modifications for her multiple health conditions.   Objective:   Blood pressure 111/62, pulse 86, temperature 97.8 F (36.6 C), height 5\' 3"  (1.6 m), weight 199 lb (90.3 kg), SpO2 100 %. Body mass index is 35.25 kg/m.  General: Cooperative, alert, well developed, in no acute distress. HEENT: Conjunctivae and lids unremarkable. Cardiovascular: Regular rhythm.  Lungs: Normal work of breathing. Neurologic: No focal deficits.   Lab Results  Component Value Date   CREATININE 1.13 (H) 06/04/2021   BUN 11 06/04/2021   NA 135 06/04/2021   K 3.7 06/04/2021   CL 102 06/04/2021   CO2 25 06/04/2021   Lab Results  Component Value Date   ALT 38 06/04/2021   AST 35 06/04/2021   ALKPHOS 95 06/04/2021   BILITOT 1.0 06/04/2021   Lab Results  Component Value Date   HGBA1C 5.4 06/03/2021   HGBA1C 5.6 05/05/2021   HGBA1C 5.5 11/18/2020   Lab Results  Component Value Date   INSULIN 6.9 05/05/2021   INSULIN 9.8 11/18/2020   Lab Results  Component Value Date   TSH 3.130 11/18/2020   Lab Results  Component Value Date   CHOL 234 (H) 05/05/2021   HDL 75 05/05/2021   LDLCALC 141 (H) 05/05/2021  TRIG 103 05/05/2021   Lab Results  Component Value Date   VD25OH 47.5 05/05/2021   VD25OH 35.7 11/18/2020   Lab Results  Component Value Date   WBC 12.0 (H) 06/04/2021   HGB 11.9 (L) 06/04/2021   HCT 35.1 (L) 06/04/2021   MCV 93.1 06/04/2021   PLT 159 06/04/2021    Attestation Statements:   Reviewed by clinician on day of visit: allergies, medications, problem list, medical history,  surgical history, family history, social history, and previous encounter notes.  Time spent on visit including pre-visit chart review and post-visit care and charting was 14 minutes.   Coral Ceo, CMA, am acting as transcriptionist for [name], MD.   I have reviewed the above documentation for accuracy and completeness, and I agree with the above. - [name], MD

## 2021-07-20 DIAGNOSIS — E7800 Pure hypercholesterolemia, unspecified: Secondary | ICD-10-CM | POA: Diagnosis not present

## 2021-07-20 DIAGNOSIS — R309 Painful micturition, unspecified: Secondary | ICD-10-CM | POA: Diagnosis not present

## 2021-07-20 DIAGNOSIS — K5790 Diverticulosis of intestine, part unspecified, without perforation or abscess without bleeding: Secondary | ICD-10-CM | POA: Diagnosis not present

## 2021-07-20 DIAGNOSIS — R739 Hyperglycemia, unspecified: Secondary | ICD-10-CM | POA: Diagnosis not present

## 2021-07-20 DIAGNOSIS — Z7189 Other specified counseling: Secondary | ICD-10-CM | POA: Diagnosis not present

## 2021-07-20 DIAGNOSIS — Z0001 Encounter for general adult medical examination with abnormal findings: Secondary | ICD-10-CM | POA: Diagnosis not present

## 2021-07-20 DIAGNOSIS — E559 Vitamin D deficiency, unspecified: Secondary | ICD-10-CM | POA: Diagnosis not present

## 2021-07-20 DIAGNOSIS — M79641 Pain in right hand: Secondary | ICD-10-CM | POA: Diagnosis not present

## 2021-07-20 DIAGNOSIS — I10 Essential (primary) hypertension: Secondary | ICD-10-CM | POA: Diagnosis not present

## 2021-07-20 DIAGNOSIS — E790 Hyperuricemia without signs of inflammatory arthritis and tophaceous disease: Secondary | ICD-10-CM | POA: Diagnosis not present

## 2021-07-20 DIAGNOSIS — Z1389 Encounter for screening for other disorder: Secondary | ICD-10-CM | POA: Diagnosis not present

## 2021-07-20 DIAGNOSIS — Z0000 Encounter for general adult medical examination without abnormal findings: Secondary | ICD-10-CM | POA: Diagnosis not present

## 2021-08-02 ENCOUNTER — Encounter (INDEPENDENT_AMBULATORY_CARE_PROVIDER_SITE_OTHER): Payer: Self-pay | Admitting: Family Medicine

## 2021-08-02 ENCOUNTER — Other Ambulatory Visit: Payer: Self-pay

## 2021-08-02 ENCOUNTER — Ambulatory Visit (INDEPENDENT_AMBULATORY_CARE_PROVIDER_SITE_OTHER): Payer: Medicare Other | Admitting: Family Medicine

## 2021-08-02 VITALS — BP 130/84 | HR 88 | Temp 97.9°F | Ht 63.0 in | Wt 198.0 lb

## 2021-08-02 DIAGNOSIS — E7849 Other hyperlipidemia: Secondary | ICD-10-CM | POA: Diagnosis not present

## 2021-08-02 DIAGNOSIS — Z6838 Body mass index (BMI) 38.0-38.9, adult: Secondary | ICD-10-CM | POA: Diagnosis not present

## 2021-08-02 DIAGNOSIS — I10 Essential (primary) hypertension: Secondary | ICD-10-CM | POA: Diagnosis not present

## 2021-08-02 NOTE — Progress Notes (Signed)
Chief Complaint:   OBESITY Laura Haas is here to discuss her progress with her obesity treatment plan along with follow-up of her obesity related diagnoses. Laura Haas is on the Category 2 Plan and states she is following her eating plan approximately 50-60% of the time. Laura Haas states she is swimming and walking 60 minutes 2-5 times per week.  Today's visit was #: 11 Starting weight: 215 lbs Starting date: 11/18/2020 Today's weight: 198 lbs Today's date: 08/02/2021 Total lbs lost to date: 17 Total lbs lost since last in-office visit: 1  Interim History: Over the last few weeks, Laura Haas was mostly adherent but the last week she had book club, friendsgiving, and a get together. Next week she may or may not be traveling to [place]. She is hosting a neighborhood dinner on December 12th. Pt is traveling to [place] for the Christmas holidays. She is traveling to [facility], then at the end of January, going to [place]. Pt makes lots of soups in the winter and is wondering how to incorporate them into the meal plan.  Subjective:   1. Essential hypertension BP well controlled today. Pt denies chest pain/chest pressure/headache.  2. Other hyperlipidemia Laura Haas is not on statin therapy. Her last LDL was 141, HDL 75, and triglycerides 103.  Assessment/Plan:   1. Essential hypertension Laura Haas is working on healthy weight loss and exercise to improve blood pressure control. We will watch for signs of hypotension as she continues her lifestyle modifications. Continue current meds with no change in dose.  2. Other hyperlipidemia Cardiovascular risk and specific lipid/LDL goals reviewed.  We discussed several lifestyle modifications today and Laura Haas will continue to work on diet, exercise and weight loss efforts. Orders and follow up as documented in patient record. Repeat labs in January.  Counseling Intensive lifestyle modifications are the first line treatment for this issue. Dietary  changes: Increase soluble fiber. Decrease simple carbohydrates. Exercise changes: Moderate to vigorous-intensity aerobic activity 150 minutes per week if tolerated. Lipid-lowering medications: see documented in medical record.  3. Obesity with current BMI of 35.1  Laura Haas is currently in the action stage of change. As such, her goal is to continue with weight loss efforts. She has agreed to the Category 2 Plan.   Exercise goals:  As is  Behavioral modification strategies: increasing lean protein intake, meal planning and cooking strategies, keeping healthy foods in the home, and planning for success.  Laura Haas has agreed to follow-up with our clinic in 3 weeks. She was informed of the importance of frequent follow-up visits to maximize her success with intensive lifestyle modifications for her multiple health conditions.   Objective:   Blood pressure 130/84, pulse 88, temperature 97.9 F (36.6 C), height 5\' 3"  (1.6 m), weight 198 lb (89.8 kg), SpO2 99 %. Body mass index is 35.07 kg/m.  General: Cooperative, alert, well developed, in no acute distress. HEENT: Conjunctivae and lids unremarkable. Cardiovascular: Regular rhythm.  Lungs: Normal work of breathing. Neurologic: No focal deficits.   Lab Results  Component Value Date   CREATININE 1.13 (H) 06/04/2021   BUN 11 06/04/2021   NA 135 06/04/2021   K 3.7 06/04/2021   CL 102 06/04/2021   CO2 25 06/04/2021   Lab Results  Component Value Date   ALT 38 06/04/2021   AST 35 06/04/2021   ALKPHOS 95 06/04/2021   BILITOT 1.0 06/04/2021   Lab Results  Component Value Date   HGBA1C 5.4 06/03/2021   HGBA1C 5.6 05/05/2021   HGBA1C 5.5  11/18/2020   Lab Results  Component Value Date   INSULIN 6.9 05/05/2021   INSULIN 9.8 11/18/2020   Lab Results  Component Value Date   TSH 3.130 11/18/2020   Lab Results  Component Value Date   CHOL 234 (H) 05/05/2021   HDL 75 05/05/2021   LDLCALC 141 (H) 05/05/2021   TRIG 103 05/05/2021    Lab Results  Component Value Date   VD25OH 47.5 05/05/2021   VD25OH 35.7 11/18/2020   Lab Results  Component Value Date   WBC 12.0 (H) 06/04/2021   HGB 11.9 (L) 06/04/2021   HCT 35.1 (L) 06/04/2021   MCV 93.1 06/04/2021   PLT 159 06/04/2021    Attestation Statements:   Reviewed by clinician on day of visit: allergies, medications, problem list, medical history, surgical history, family history, social history, and previous encounter notes.  Time spent on visit including pre-visit chart review and post-visit care and charting was 15 minutes.   Coral Ceo, CMA, am acting as transcriptionist for [name], MD.   I have reviewed the above documentation for accuracy and completeness, and I agree with the above. - [name], MD

## 2021-08-26 ENCOUNTER — Ambulatory Visit (INDEPENDENT_AMBULATORY_CARE_PROVIDER_SITE_OTHER): Payer: Medicare Other | Admitting: Family Medicine

## 2021-09-17 ENCOUNTER — Encounter (INDEPENDENT_AMBULATORY_CARE_PROVIDER_SITE_OTHER): Payer: Self-pay

## 2021-09-22 ENCOUNTER — Ambulatory Visit (INDEPENDENT_AMBULATORY_CARE_PROVIDER_SITE_OTHER): Payer: Medicare Other | Admitting: Family Medicine

## 2021-10-06 ENCOUNTER — Other Ambulatory Visit: Payer: Self-pay

## 2021-10-06 ENCOUNTER — Ambulatory Visit (INDEPENDENT_AMBULATORY_CARE_PROVIDER_SITE_OTHER): Payer: Medicare Other | Admitting: Family Medicine

## 2021-10-06 ENCOUNTER — Encounter (INDEPENDENT_AMBULATORY_CARE_PROVIDER_SITE_OTHER): Payer: Self-pay | Admitting: Family Medicine

## 2021-10-06 VITALS — BP 107/63 | HR 85 | Temp 98.2°F | Ht 63.0 in | Wt 199.0 lb

## 2021-10-06 DIAGNOSIS — Z6835 Body mass index (BMI) 35.0-35.9, adult: Secondary | ICD-10-CM

## 2021-10-06 DIAGNOSIS — I10 Essential (primary) hypertension: Secondary | ICD-10-CM

## 2021-10-06 DIAGNOSIS — E669 Obesity, unspecified: Secondary | ICD-10-CM | POA: Diagnosis not present

## 2021-10-06 DIAGNOSIS — E559 Vitamin D deficiency, unspecified: Secondary | ICD-10-CM | POA: Diagnosis not present

## 2021-10-06 NOTE — Progress Notes (Signed)
Chief Complaint:   OBESITY Laura Haas is here to discuss her progress with her obesity treatment plan along with follow-up of her obesity related diagnoses. Laura Haas is on the Category 2 Plan and states she is following her eating plan approximately 60% of the time. Laura Haas states she is walking 1.5 miles 3 times per week.  Today's visit was #: 12 Starting weight: 215 lbs Starting date: 11/18/2020 Today's weight: 199 lbs Today's date: 10/06/2021 Total lbs lost to date: 16 Total lbs lost since last in-office visit: 0  Interim History: Pt got COVID over the holiday and her mother-in-law passed away in 09-09-2023. She is going to [place] for 2 weeks at the end of this month and then a 3 week long [place] in March. Her biggest obstacle is alcoholic beverages while away.  Subjective:   1. Essential hypertension BP well controlled today. Pt denies chest pain/chest pressure/headache. She took meds about 2-3 hours ago.  2. Vitamin D deficiency Pt denies nausea, vomiting, and muscle weakness but notes fatigue. She stopped prescription Vit D.  Assessment/Plan:   1. Essential hypertension Laura Haas is working on healthy weight loss and exercise to improve blood pressure control. We will watch for signs of hypotension as she continues her lifestyle modifications. Continue Micardis with no change in dose.  2. Vitamin D deficiency Low Vitamin D level contributes to fatigue and are associated with obesity, breast, and colon cancer. She agrees to start to take OTC Vitamin D 2,000 IU daily and will follow-up for routine testing of Vitamin D, at least 2-3 times per year to avoid over-replacement.  3. Obesity with current BMI of 35.3  Laura Haas is currently in the action stage of change. As such, her goal is to continue with weight loss efforts. She has agreed to the Category 2 Plan.   Exercise goals: All adults should avoid inactivity. Some physical activity is better than none, and adults who  participate in any amount of physical activity gain some health benefits.  Behavioral modification strategies: increasing lean protein intake, meal planning and cooking strategies, keeping healthy foods in the home, and travel eating strategies.  Laura Haas has agreed to follow-up with our clinic in 3-4 weeks. She was informed of the importance of frequent follow-up visits to maximize her success with intensive lifestyle modifications for her multiple health conditions.   Objective:   Blood pressure 107/63, pulse 85, temperature 98.2 F (36.8 C), height 5\' 3"  (1.6 m), weight 199 lb (90.3 kg), SpO2 98 %. Body mass index is 35.25 kg/m.  General: Cooperative, alert, well developed, in no acute distress. HEENT: Conjunctivae and lids unremarkable. Cardiovascular: Regular rhythm.  Lungs: Normal work of breathing. Neurologic: No focal deficits.   Lab Results  Component Value Date   CREATININE 1.13 (H) 06/04/2021   BUN 11 06/04/2021   NA 135 06/04/2021   K 3.7 06/04/2021   CL 102 06/04/2021   CO2 25 06/04/2021   Lab Results  Component Value Date   ALT 38 06/04/2021   AST 35 06/04/2021   ALKPHOS 95 06/04/2021   BILITOT 1.0 06/04/2021   Lab Results  Component Value Date   HGBA1C 5.4 06/03/2021   HGBA1C 5.6 05/05/2021   HGBA1C 5.5 11/18/2020   Lab Results  Component Value Date   INSULIN 6.9 05/05/2021   INSULIN 9.8 11/18/2020   Lab Results  Component Value Date   TSH 3.130 11/18/2020   Lab Results  Component Value Date   CHOL 234 (H) 05/05/2021  HDL 75 05/05/2021   LDLCALC 141 (H) 05/05/2021   TRIG 103 05/05/2021   Lab Results  Component Value Date   VD25OH 47.5 05/05/2021   VD25OH 35.7 11/18/2020   Lab Results  Component Value Date   WBC 12.0 (H) 06/04/2021   HGB 11.9 (L) 06/04/2021   HCT 35.1 (L) 06/04/2021   MCV 93.1 06/04/2021   PLT 159 06/04/2021    Attestation Statements:   Reviewed by clinician on day of visit: allergies, medications, problem list,  medical history, surgical history, family history, social history, and previous encounter notes.  Coral Ceo, CMA, am acting as transcriptionist for [name], MD.   I have reviewed the above documentation for accuracy and completeness, and I agree with the above. - [name], MD

## 2021-11-04 ENCOUNTER — Ambulatory Visit (INDEPENDENT_AMBULATORY_CARE_PROVIDER_SITE_OTHER): Payer: Medicare Other | Admitting: Family Medicine

## 2021-12-22 DIAGNOSIS — Z79899 Other long term (current) drug therapy: Secondary | ICD-10-CM | POA: Diagnosis not present

## 2021-12-22 DIAGNOSIS — E790 Hyperuricemia without signs of inflammatory arthritis and tophaceous disease: Secondary | ICD-10-CM | POA: Diagnosis not present

## 2021-12-22 DIAGNOSIS — M154 Erosive (osteo)arthritis: Secondary | ICD-10-CM | POA: Diagnosis not present

## 2021-12-22 DIAGNOSIS — M1991 Primary osteoarthritis, unspecified site: Secondary | ICD-10-CM | POA: Diagnosis not present

## 2022-04-27 ENCOUNTER — Encounter (INDEPENDENT_AMBULATORY_CARE_PROVIDER_SITE_OTHER): Payer: Self-pay

## 2022-05-10 ENCOUNTER — Other Ambulatory Visit: Payer: Self-pay | Admitting: Internal Medicine

## 2022-05-10 DIAGNOSIS — Z1231 Encounter for screening mammogram for malignant neoplasm of breast: Secondary | ICD-10-CM

## 2022-06-13 ENCOUNTER — Ambulatory Visit: Payer: Medicare Other

## 2022-06-23 DIAGNOSIS — E790 Hyperuricemia without signs of inflammatory arthritis and tophaceous disease: Secondary | ICD-10-CM | POA: Diagnosis not present

## 2022-06-23 DIAGNOSIS — M154 Erosive (osteo)arthritis: Secondary | ICD-10-CM | POA: Diagnosis not present

## 2022-06-23 DIAGNOSIS — M1991 Primary osteoarthritis, unspecified site: Secondary | ICD-10-CM | POA: Diagnosis not present

## 2022-06-23 DIAGNOSIS — Z79899 Other long term (current) drug therapy: Secondary | ICD-10-CM | POA: Diagnosis not present

## 2022-07-05 ENCOUNTER — Ambulatory Visit
Admission: RE | Admit: 2022-07-05 | Discharge: 2022-07-05 | Disposition: A | Discharge status: Home / Self Care | Payer: Medicare Other | Source: Ambulatory Visit | Attending: Internal Medicine | Admitting: Internal Medicine

## 2022-07-05 DIAGNOSIS — Z1231 Encounter for screening mammogram for malignant neoplasm of breast: Secondary | ICD-10-CM

## 2022-07-07 ENCOUNTER — Other Ambulatory Visit: Payer: Self-pay | Admitting: Internal Medicine

## 2022-07-07 DIAGNOSIS — R928 Other abnormal and inconclusive findings on diagnostic imaging of breast: Secondary | ICD-10-CM

## 2022-07-11 DIAGNOSIS — M154 Erosive (osteo)arthritis: Secondary | ICD-10-CM | POA: Diagnosis not present

## 2022-07-11 DIAGNOSIS — H524 Presbyopia: Secondary | ICD-10-CM | POA: Diagnosis not present

## 2022-07-11 DIAGNOSIS — H5213 Myopia, bilateral: Secondary | ICD-10-CM | POA: Diagnosis not present

## 2022-07-11 DIAGNOSIS — H25011 Cortical age-related cataract, right eye: Secondary | ICD-10-CM | POA: Diagnosis not present

## 2022-07-11 DIAGNOSIS — Z79899 Other long term (current) drug therapy: Secondary | ICD-10-CM | POA: Diagnosis not present

## 2022-07-11 DIAGNOSIS — H2511 Age-related nuclear cataract, right eye: Secondary | ICD-10-CM | POA: Diagnosis not present

## 2022-07-11 DIAGNOSIS — Z961 Presence of intraocular lens: Secondary | ICD-10-CM | POA: Diagnosis not present

## 2022-07-16 ENCOUNTER — Ambulatory Visit
Admission: RE | Admit: 2022-07-16 | Discharge: 2022-07-16 | Disposition: A | Discharge status: Home / Self Care | Payer: Medicare Other | Source: Ambulatory Visit | Attending: Internal Medicine | Admitting: Internal Medicine

## 2022-07-16 DIAGNOSIS — R921 Mammographic calcification found on diagnostic imaging of breast: Secondary | ICD-10-CM | POA: Diagnosis not present

## 2022-07-16 DIAGNOSIS — R928 Other abnormal and inconclusive findings on diagnostic imaging of breast: Secondary | ICD-10-CM

## 2022-07-27 DIAGNOSIS — Z79899 Other long term (current) drug therapy: Secondary | ICD-10-CM | POA: Diagnosis not present

## 2022-07-27 DIAGNOSIS — E559 Vitamin D deficiency, unspecified: Secondary | ICD-10-CM | POA: Diagnosis not present

## 2022-07-27 DIAGNOSIS — E7800 Pure hypercholesterolemia, unspecified: Secondary | ICD-10-CM | POA: Diagnosis not present

## 2022-07-27 DIAGNOSIS — Z889 Allergy status to unspecified drugs, medicaments and biological substances status: Secondary | ICD-10-CM | POA: Diagnosis not present

## 2022-07-27 DIAGNOSIS — I10 Essential (primary) hypertension: Secondary | ICD-10-CM | POA: Diagnosis not present

## 2022-07-27 DIAGNOSIS — K582 Mixed irritable bowel syndrome: Secondary | ICD-10-CM | POA: Diagnosis not present

## 2022-07-27 DIAGNOSIS — Z0000 Encounter for general adult medical examination without abnormal findings: Secondary | ICD-10-CM | POA: Diagnosis not present

## 2022-08-10 DIAGNOSIS — N179 Acute kidney failure, unspecified: Secondary | ICD-10-CM | POA: Diagnosis not present

## 2022-08-17 DIAGNOSIS — Z9189 Other specified personal risk factors, not elsewhere classified: Secondary | ICD-10-CM | POA: Diagnosis not present

## 2022-08-29 DIAGNOSIS — D1801 Hemangioma of skin and subcutaneous tissue: Secondary | ICD-10-CM | POA: Diagnosis not present

## 2022-08-29 DIAGNOSIS — Z85828 Personal history of other malignant neoplasm of skin: Secondary | ICD-10-CM | POA: Diagnosis not present

## 2022-08-29 DIAGNOSIS — L821 Other seborrheic keratosis: Secondary | ICD-10-CM | POA: Diagnosis not present

## 2022-08-29 DIAGNOSIS — L603 Nail dystrophy: Secondary | ICD-10-CM | POA: Diagnosis not present

## 2022-08-29 DIAGNOSIS — L814 Other melanin hyperpigmentation: Secondary | ICD-10-CM | POA: Diagnosis not present

## 2022-08-29 DIAGNOSIS — D2262 Melanocytic nevi of left upper limb, including shoulder: Secondary | ICD-10-CM | POA: Diagnosis not present

## 2022-08-29 DIAGNOSIS — D2239 Melanocytic nevi of other parts of face: Secondary | ICD-10-CM | POA: Diagnosis not present

## 2022-10-28 IMAGING — MG MM DIGITAL SCREENING BILAT W/ TOMO AND CAD
8 series · 8 of 24 positions shown · non-contrast
Comparison: Previous exam(s).

CLINICAL DATA: Screening.

EXAM:
DIGITAL SCREENING BILATERAL MAMMOGRAM WITH TOMOSYNTHESIS AND CAD
TECHNIQUE: Bilateral screening digital craniocaudal and mediolateral oblique
mammograms were obtained. Bilateral screening digital breast
tomosynthesis was performed. The images were evaluated with
computer-aided detection.

[R CC synth-2D]
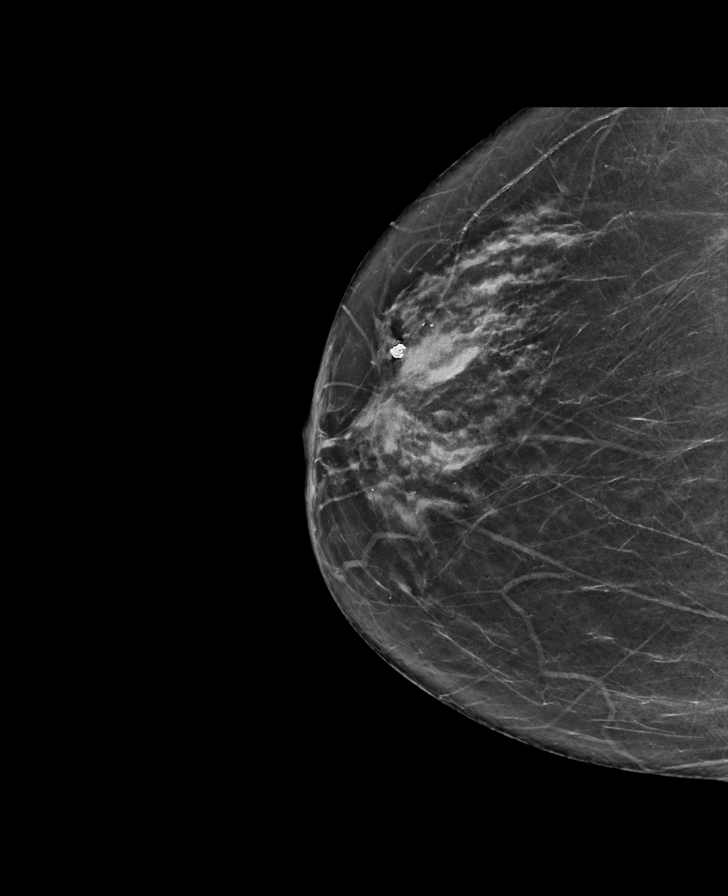

[L MLO synth-2D]
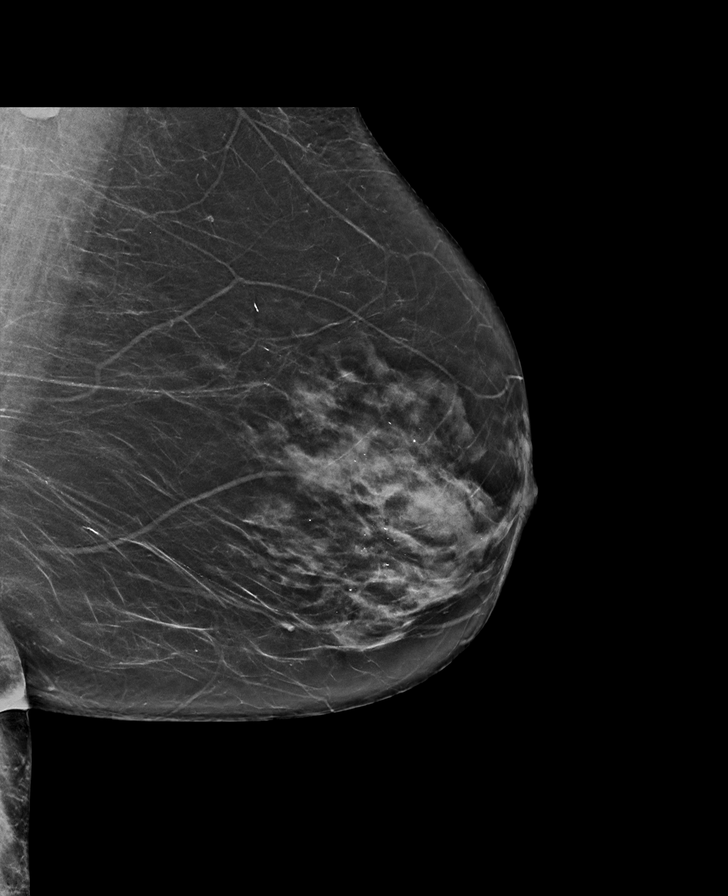

[L CC synth-2D]
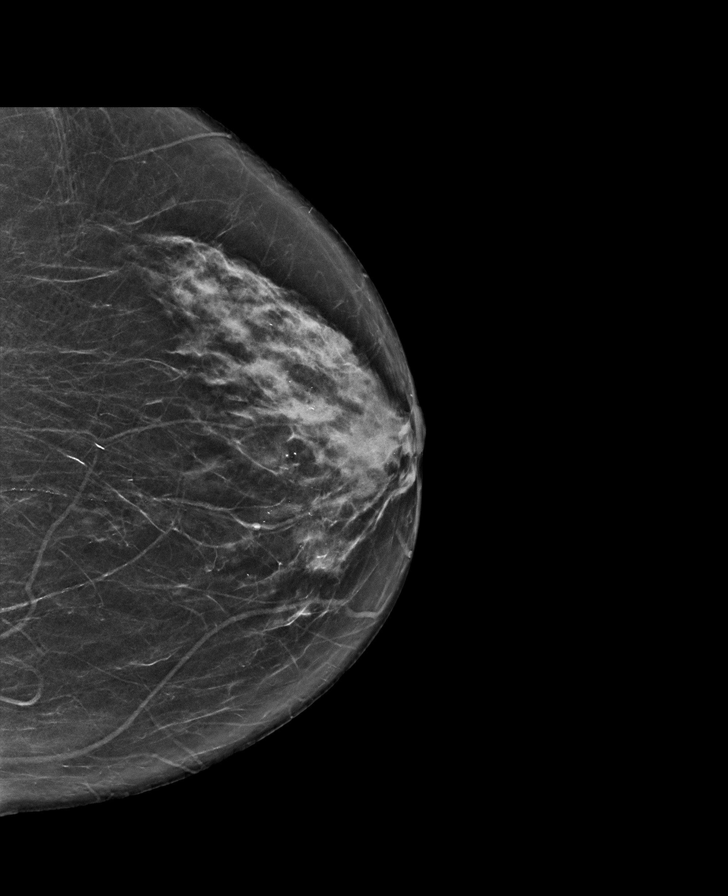

[R MLO synth-2D]
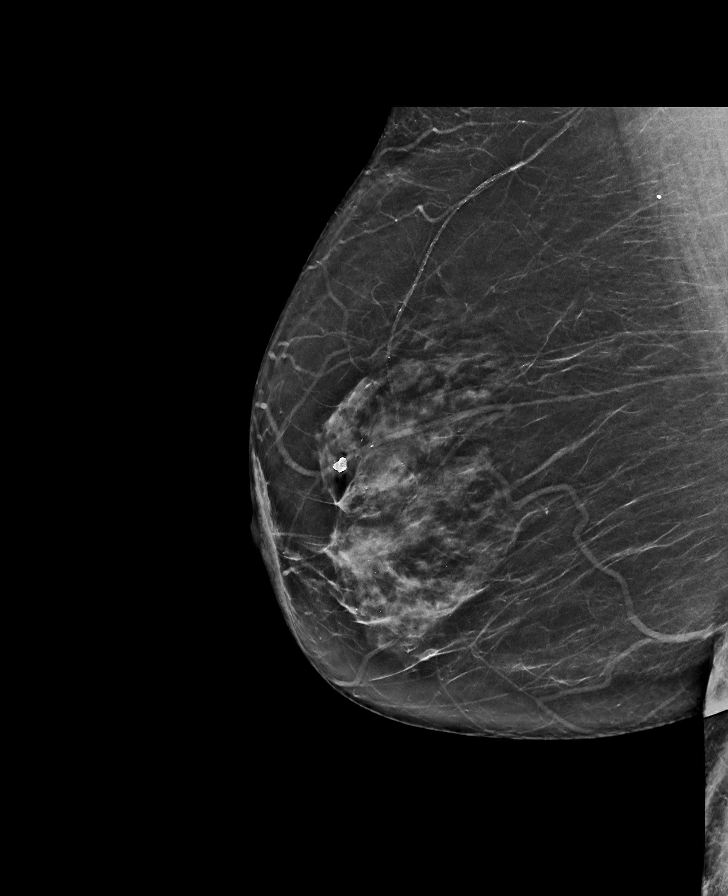

[L CC tomo · tomo slice 33/66.0]
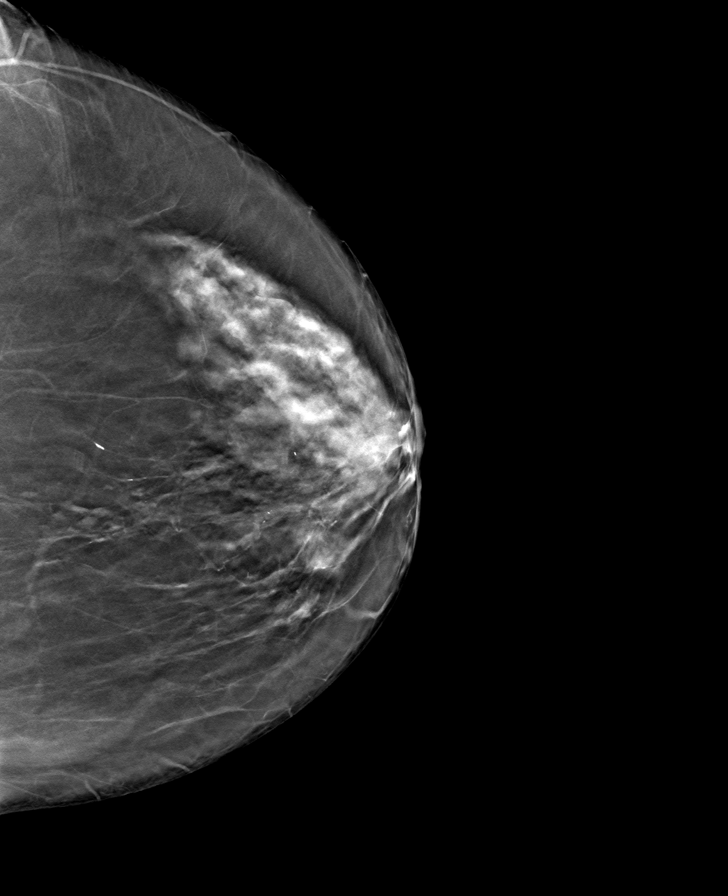

[R MLO tomo · tomo slice 35/70.0]
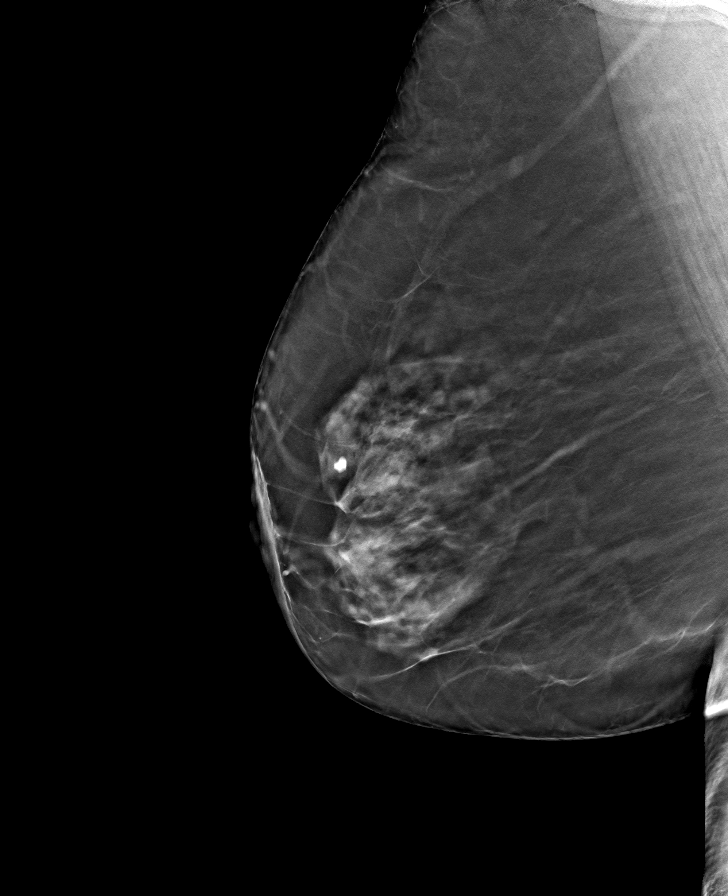

[L MLO tomo · tomo slice 35/69.0]
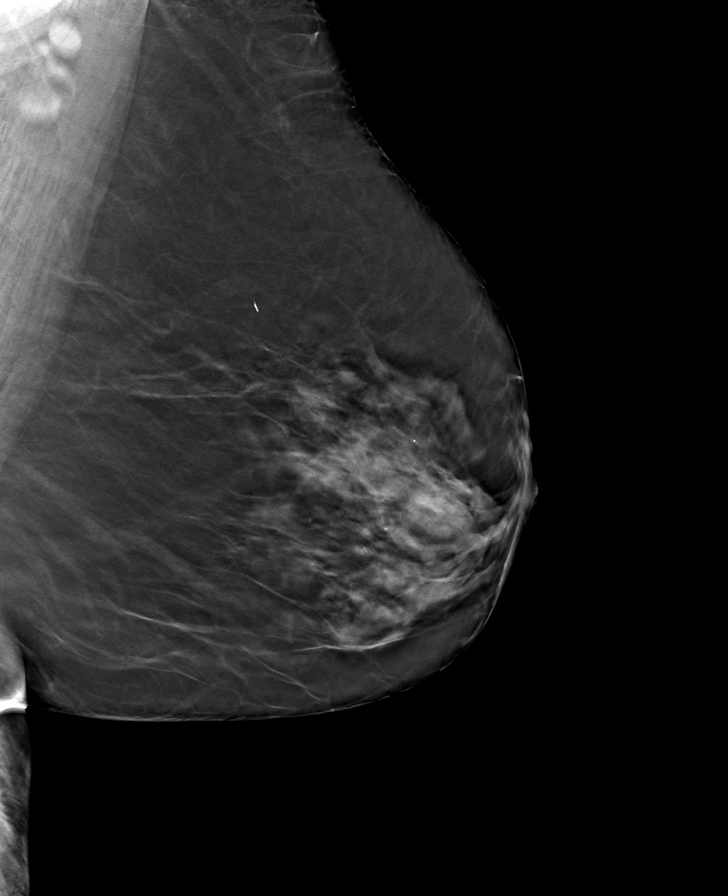

[R CC tomo · tomo slice 33/64.0]
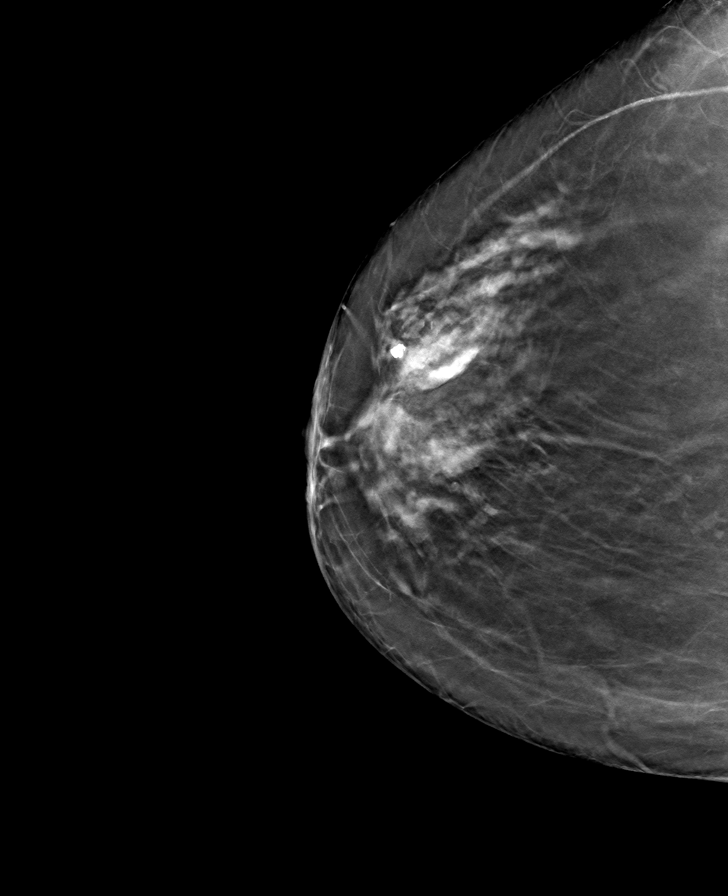

[8 of 24 positions shown; findings below may reference images not displayed]

ACR Breast Density Category c: The breast tissue is heterogeneously
dense, which may obscure small masses.
FINDINGS: There are no findings suspicious for malignancy.
IMPRESSION: No mammographic evidence of malignancy. A result letter of this
screening mammogram will be mailed directly to the patient.

RECOMMENDATION:
Screening mammogram in one year. (Code:Q3-W-BC3)

BI-RADS CATEGORY  1: Negative.

## 2022-11-09 DIAGNOSIS — Z85828 Personal history of other malignant neoplasm of skin: Secondary | ICD-10-CM | POA: Diagnosis not present

## 2022-11-09 DIAGNOSIS — L821 Other seborrheic keratosis: Secondary | ICD-10-CM | POA: Diagnosis not present

## 2022-11-09 DIAGNOSIS — D224 Melanocytic nevi of scalp and neck: Secondary | ICD-10-CM | POA: Diagnosis not present

## 2022-12-27 DIAGNOSIS — E790 Hyperuricemia without signs of inflammatory arthritis and tophaceous disease: Secondary | ICD-10-CM | POA: Diagnosis not present

## 2022-12-27 DIAGNOSIS — H9313 Tinnitus, bilateral: Secondary | ICD-10-CM | POA: Diagnosis not present

## 2022-12-27 DIAGNOSIS — M154 Erosive (osteo)arthritis: Secondary | ICD-10-CM | POA: Diagnosis not present

## 2022-12-27 DIAGNOSIS — Z79899 Other long term (current) drug therapy: Secondary | ICD-10-CM | POA: Diagnosis not present

## 2023-06-22 ENCOUNTER — Other Ambulatory Visit: Payer: Self-pay | Admitting: Internal Medicine

## 2023-06-22 DIAGNOSIS — Z1231 Encounter for screening mammogram for malignant neoplasm of breast: Secondary | ICD-10-CM

## 2023-07-05 DIAGNOSIS — H2511 Age-related nuclear cataract, right eye: Secondary | ICD-10-CM | POA: Diagnosis not present

## 2023-07-05 DIAGNOSIS — Z961 Presence of intraocular lens: Secondary | ICD-10-CM | POA: Diagnosis not present

## 2023-07-05 DIAGNOSIS — H524 Presbyopia: Secondary | ICD-10-CM | POA: Diagnosis not present

## 2023-07-05 DIAGNOSIS — Z79899 Other long term (current) drug therapy: Secondary | ICD-10-CM | POA: Diagnosis not present

## 2023-07-05 DIAGNOSIS — H52203 Unspecified astigmatism, bilateral: Secondary | ICD-10-CM | POA: Diagnosis not present

## 2023-07-06 DIAGNOSIS — Z79899 Other long term (current) drug therapy: Secondary | ICD-10-CM | POA: Diagnosis not present

## 2023-07-06 DIAGNOSIS — E790 Hyperuricemia without signs of inflammatory arthritis and tophaceous disease: Secondary | ICD-10-CM | POA: Diagnosis not present

## 2023-07-06 DIAGNOSIS — M154 Erosive (osteo)arthritis: Secondary | ICD-10-CM | POA: Diagnosis not present

## 2023-07-06 DIAGNOSIS — M1991 Primary osteoarthritis, unspecified site: Secondary | ICD-10-CM | POA: Diagnosis not present

## 2023-07-19 ENCOUNTER — Ambulatory Visit
Admission: RE | Admit: 2023-07-19 | Discharge: 2023-07-19 | Disposition: A | Discharge status: Home / Self Care | Payer: Medicare Other | Source: Ambulatory Visit | Attending: Internal Medicine | Admitting: Internal Medicine

## 2023-07-19 DIAGNOSIS — Z1231 Encounter for screening mammogram for malignant neoplasm of breast: Secondary | ICD-10-CM

## 2023-08-09 ENCOUNTER — Other Ambulatory Visit (HOSPITAL_COMMUNITY): Payer: Self-pay | Admitting: Internal Medicine

## 2023-08-09 DIAGNOSIS — Z889 Allergy status to unspecified drugs, medicaments and biological substances status: Secondary | ICD-10-CM | POA: Diagnosis not present

## 2023-08-09 DIAGNOSIS — S76011A Strain of muscle, fascia and tendon of right hip, initial encounter: Secondary | ICD-10-CM | POA: Diagnosis not present

## 2023-08-09 DIAGNOSIS — Z0000 Encounter for general adult medical examination without abnormal findings: Secondary | ICD-10-CM | POA: Diagnosis not present

## 2023-08-09 DIAGNOSIS — R739 Hyperglycemia, unspecified: Secondary | ICD-10-CM | POA: Diagnosis not present

## 2023-08-09 DIAGNOSIS — K582 Mixed irritable bowel syndrome: Secondary | ICD-10-CM | POA: Diagnosis not present

## 2023-08-09 DIAGNOSIS — E7800 Pure hypercholesterolemia, unspecified: Secondary | ICD-10-CM | POA: Diagnosis not present

## 2023-08-09 DIAGNOSIS — Z9181 History of falling: Secondary | ICD-10-CM | POA: Diagnosis not present

## 2023-08-09 DIAGNOSIS — E559 Vitamin D deficiency, unspecified: Secondary | ICD-10-CM | POA: Diagnosis not present

## 2023-08-09 DIAGNOSIS — N1831 Chronic kidney disease, stage 3a: Secondary | ICD-10-CM | POA: Diagnosis not present

## 2023-08-11 DIAGNOSIS — M25551 Pain in right hip: Secondary | ICD-10-CM | POA: Diagnosis not present

## 2023-08-11 DIAGNOSIS — M79604 Pain in right leg: Secondary | ICD-10-CM | POA: Diagnosis not present

## 2023-08-22 ENCOUNTER — Ambulatory Visit (HOSPITAL_COMMUNITY)
Admission: RE | Admit: 2023-08-22 | Discharge: 2023-08-22 | Disposition: A | Discharge status: Home / Self Care | Payer: Medicare Other | Source: Ambulatory Visit | Attending: Internal Medicine | Admitting: Internal Medicine

## 2023-08-22 DIAGNOSIS — E7800 Pure hypercholesterolemia, unspecified: Secondary | ICD-10-CM | POA: Insufficient documentation

## 2023-08-23 DIAGNOSIS — Z9189 Other specified personal risk factors, not elsewhere classified: Secondary | ICD-10-CM | POA: Diagnosis not present

## 2023-08-23 DIAGNOSIS — M79604 Pain in right leg: Secondary | ICD-10-CM | POA: Diagnosis not present

## 2023-08-23 DIAGNOSIS — M25551 Pain in right hip: Secondary | ICD-10-CM | POA: Diagnosis not present

## 2023-08-30 DIAGNOSIS — M79604 Pain in right leg: Secondary | ICD-10-CM | POA: Diagnosis not present

## 2023-08-30 DIAGNOSIS — M25551 Pain in right hip: Secondary | ICD-10-CM | POA: Diagnosis not present

## 2023-09-06 DIAGNOSIS — M79604 Pain in right leg: Secondary | ICD-10-CM | POA: Diagnosis not present

## 2023-09-06 DIAGNOSIS — M25551 Pain in right hip: Secondary | ICD-10-CM | POA: Diagnosis not present

## 2023-10-05 DIAGNOSIS — Z79899 Other long term (current) drug therapy: Secondary | ICD-10-CM | POA: Diagnosis not present

## 2023-11-01 DIAGNOSIS — J0100 Acute maxillary sinusitis, unspecified: Secondary | ICD-10-CM | POA: Diagnosis not present

## 2023-12-11 DIAGNOSIS — D2261 Melanocytic nevi of right upper limb, including shoulder: Secondary | ICD-10-CM | POA: Diagnosis not present

## 2023-12-11 DIAGNOSIS — D2271 Melanocytic nevi of right lower limb, including hip: Secondary | ICD-10-CM | POA: Diagnosis not present

## 2023-12-11 DIAGNOSIS — C44619 Basal cell carcinoma of skin of left upper limb, including shoulder: Secondary | ICD-10-CM | POA: Diagnosis not present

## 2023-12-11 DIAGNOSIS — D485 Neoplasm of uncertain behavior of skin: Secondary | ICD-10-CM | POA: Diagnosis not present

## 2023-12-11 DIAGNOSIS — L812 Freckles: Secondary | ICD-10-CM | POA: Diagnosis not present

## 2023-12-11 DIAGNOSIS — Z85828 Personal history of other malignant neoplasm of skin: Secondary | ICD-10-CM | POA: Diagnosis not present

## 2023-12-11 DIAGNOSIS — D2262 Melanocytic nevi of left upper limb, including shoulder: Secondary | ICD-10-CM | POA: Diagnosis not present

## 2023-12-11 DIAGNOSIS — L821 Other seborrheic keratosis: Secondary | ICD-10-CM | POA: Diagnosis not present

## 2023-12-11 DIAGNOSIS — L820 Inflamed seborrheic keratosis: Secondary | ICD-10-CM | POA: Diagnosis not present

## 2023-12-28 DIAGNOSIS — M154 Erosive (osteo)arthritis: Secondary | ICD-10-CM | POA: Diagnosis not present

## 2023-12-28 DIAGNOSIS — E790 Hyperuricemia without signs of inflammatory arthritis and tophaceous disease: Secondary | ICD-10-CM | POA: Diagnosis not present

## 2023-12-28 DIAGNOSIS — Z79899 Other long term (current) drug therapy: Secondary | ICD-10-CM | POA: Diagnosis not present

## 2024-02-07 DIAGNOSIS — R748 Abnormal levels of other serum enzymes: Secondary | ICD-10-CM | POA: Diagnosis not present

## 2024-02-07 DIAGNOSIS — E7800 Pure hypercholesterolemia, unspecified: Secondary | ICD-10-CM | POA: Diagnosis not present

## 2024-02-07 DIAGNOSIS — I10 Essential (primary) hypertension: Secondary | ICD-10-CM | POA: Diagnosis not present

## 2024-02-07 DIAGNOSIS — S76011A Strain of muscle, fascia and tendon of right hip, initial encounter: Secondary | ICD-10-CM | POA: Diagnosis not present

## 2024-02-07 DIAGNOSIS — M542 Cervicalgia: Secondary | ICD-10-CM | POA: Diagnosis not present

## 2024-02-07 DIAGNOSIS — N1831 Chronic kidney disease, stage 3a: Secondary | ICD-10-CM | POA: Diagnosis not present

## 2024-03-06 DIAGNOSIS — M542 Cervicalgia: Secondary | ICD-10-CM | POA: Diagnosis not present

## 2024-03-18 DIAGNOSIS — E8889 Other specified metabolic disorders: Secondary | ICD-10-CM | POA: Diagnosis not present

## 2024-03-18 DIAGNOSIS — Z8679 Personal history of other diseases of the circulatory system: Secondary | ICD-10-CM | POA: Diagnosis not present

## 2024-03-18 DIAGNOSIS — N1831 Chronic kidney disease, stage 3a: Secondary | ICD-10-CM | POA: Diagnosis not present

## 2024-03-18 DIAGNOSIS — E782 Mixed hyperlipidemia: Secondary | ICD-10-CM | POA: Diagnosis not present

## 2024-03-18 DIAGNOSIS — I10 Essential (primary) hypertension: Secondary | ICD-10-CM | POA: Diagnosis not present

## 2024-03-18 DIAGNOSIS — R7303 Prediabetes: Secondary | ICD-10-CM | POA: Diagnosis not present

## 2024-03-18 DIAGNOSIS — M154 Erosive (osteo)arthritis: Secondary | ICD-10-CM | POA: Diagnosis not present

## 2024-03-18 DIAGNOSIS — R5383 Other fatigue: Secondary | ICD-10-CM | POA: Diagnosis not present

## 2024-04-01 DIAGNOSIS — I10 Essential (primary) hypertension: Secondary | ICD-10-CM | POA: Diagnosis not present

## 2024-04-01 DIAGNOSIS — M154 Erosive (osteo)arthritis: Secondary | ICD-10-CM | POA: Diagnosis not present

## 2024-04-01 DIAGNOSIS — E782 Mixed hyperlipidemia: Secondary | ICD-10-CM | POA: Diagnosis not present

## 2024-04-01 DIAGNOSIS — R5383 Other fatigue: Secondary | ICD-10-CM | POA: Diagnosis not present

## 2024-04-01 DIAGNOSIS — N1831 Chronic kidney disease, stage 3a: Secondary | ICD-10-CM | POA: Diagnosis not present

## 2024-04-01 DIAGNOSIS — R7303 Prediabetes: Secondary | ICD-10-CM | POA: Diagnosis not present

## 2024-04-01 DIAGNOSIS — Z8679 Personal history of other diseases of the circulatory system: Secondary | ICD-10-CM | POA: Diagnosis not present

## 2024-04-15 DIAGNOSIS — R221 Localized swelling, mass and lump, neck: Secondary | ICD-10-CM | POA: Diagnosis not present

## 2024-04-15 DIAGNOSIS — Z8679 Personal history of other diseases of the circulatory system: Secondary | ICD-10-CM | POA: Diagnosis not present

## 2024-04-15 DIAGNOSIS — N1831 Chronic kidney disease, stage 3a: Secondary | ICD-10-CM | POA: Diagnosis not present

## 2024-04-15 DIAGNOSIS — M154 Erosive (osteo)arthritis: Secondary | ICD-10-CM | POA: Diagnosis not present

## 2024-04-15 DIAGNOSIS — E782 Mixed hyperlipidemia: Secondary | ICD-10-CM | POA: Diagnosis not present

## 2024-04-15 DIAGNOSIS — R5383 Other fatigue: Secondary | ICD-10-CM | POA: Diagnosis not present

## 2024-04-15 DIAGNOSIS — I10 Essential (primary) hypertension: Secondary | ICD-10-CM | POA: Diagnosis not present

## 2024-04-15 DIAGNOSIS — R7303 Prediabetes: Secondary | ICD-10-CM | POA: Diagnosis not present

## 2024-04-24 DIAGNOSIS — R0683 Snoring: Secondary | ICD-10-CM | POA: Diagnosis not present

## 2024-05-01 ENCOUNTER — Other Ambulatory Visit: Payer: Self-pay | Admitting: Internal Medicine

## 2024-05-01 DIAGNOSIS — R221 Localized swelling, mass and lump, neck: Secondary | ICD-10-CM

## 2024-05-01 DIAGNOSIS — L304 Erythema intertrigo: Secondary | ICD-10-CM | POA: Diagnosis not present

## 2024-05-06 ENCOUNTER — Ambulatory Visit
Admission: RE | Admit: 2024-05-06 | Discharge: 2024-05-06 | Disposition: A | Discharge status: Home / Self Care | Source: Ambulatory Visit | Attending: Internal Medicine | Admitting: Internal Medicine

## 2024-05-06 DIAGNOSIS — R221 Localized swelling, mass and lump, neck: Secondary | ICD-10-CM | POA: Diagnosis not present

## 2024-05-06 MED ORDER — IOPAMIDOL (ISOVUE-300) INJECTION 61%
75.0000 mL | Freq: Once | INTRAVENOUS | Status: AC | PRN
  Administered 2024-05-06: 75 mL via INTRAVENOUS

## 2024-05-13 DIAGNOSIS — R221 Localized swelling, mass and lump, neck: Secondary | ICD-10-CM | POA: Diagnosis not present

## 2024-05-13 DIAGNOSIS — I10 Essential (primary) hypertension: Secondary | ICD-10-CM | POA: Diagnosis not present

## 2024-05-13 DIAGNOSIS — R5383 Other fatigue: Secondary | ICD-10-CM | POA: Diagnosis not present

## 2024-05-13 DIAGNOSIS — R7303 Prediabetes: Secondary | ICD-10-CM | POA: Diagnosis not present

## 2024-05-13 DIAGNOSIS — G4733 Obstructive sleep apnea (adult) (pediatric): Secondary | ICD-10-CM | POA: Diagnosis not present

## 2024-05-13 DIAGNOSIS — Z8679 Personal history of other diseases of the circulatory system: Secondary | ICD-10-CM | POA: Diagnosis not present

## 2024-05-13 DIAGNOSIS — N1831 Chronic kidney disease, stage 3a: Secondary | ICD-10-CM | POA: Diagnosis not present

## 2024-05-13 DIAGNOSIS — E782 Mixed hyperlipidemia: Secondary | ICD-10-CM | POA: Diagnosis not present

## 2024-05-13 DIAGNOSIS — M154 Erosive (osteo)arthritis: Secondary | ICD-10-CM | POA: Diagnosis not present

## 2024-05-21 DIAGNOSIS — G4733 Obstructive sleep apnea (adult) (pediatric): Secondary | ICD-10-CM | POA: Diagnosis not present

## 2024-06-03 DIAGNOSIS — Z8679 Personal history of other diseases of the circulatory system: Secondary | ICD-10-CM | POA: Diagnosis not present

## 2024-06-03 DIAGNOSIS — E782 Mixed hyperlipidemia: Secondary | ICD-10-CM | POA: Diagnosis not present

## 2024-06-03 DIAGNOSIS — R7303 Prediabetes: Secondary | ICD-10-CM | POA: Diagnosis not present

## 2024-06-03 DIAGNOSIS — N1831 Chronic kidney disease, stage 3a: Secondary | ICD-10-CM | POA: Diagnosis not present

## 2024-06-03 DIAGNOSIS — G4733 Obstructive sleep apnea (adult) (pediatric): Secondary | ICD-10-CM | POA: Diagnosis not present

## 2024-06-03 DIAGNOSIS — M154 Erosive (osteo)arthritis: Secondary | ICD-10-CM | POA: Diagnosis not present

## 2024-06-03 DIAGNOSIS — I10 Essential (primary) hypertension: Secondary | ICD-10-CM | POA: Diagnosis not present

## 2024-06-03 DIAGNOSIS — R221 Localized swelling, mass and lump, neck: Secondary | ICD-10-CM | POA: Diagnosis not present

## 2024-06-04 ENCOUNTER — Other Ambulatory Visit: Payer: Self-pay | Admitting: Internal Medicine

## 2024-06-04 DIAGNOSIS — Z1231 Encounter for screening mammogram for malignant neoplasm of breast: Secondary | ICD-10-CM

## 2024-06-12 DIAGNOSIS — Z85828 Personal history of other malignant neoplasm of skin: Secondary | ICD-10-CM | POA: Diagnosis not present

## 2024-06-12 DIAGNOSIS — L570 Actinic keratosis: Secondary | ICD-10-CM | POA: Diagnosis not present

## 2024-06-12 DIAGNOSIS — L821 Other seborrheic keratosis: Secondary | ICD-10-CM | POA: Diagnosis not present

## 2024-06-17 DIAGNOSIS — I10 Essential (primary) hypertension: Secondary | ICD-10-CM | POA: Diagnosis not present

## 2024-06-17 DIAGNOSIS — E782 Mixed hyperlipidemia: Secondary | ICD-10-CM | POA: Diagnosis not present

## 2024-06-17 DIAGNOSIS — R221 Localized swelling, mass and lump, neck: Secondary | ICD-10-CM | POA: Diagnosis not present

## 2024-06-17 DIAGNOSIS — R7303 Prediabetes: Secondary | ICD-10-CM | POA: Diagnosis not present

## 2024-06-17 DIAGNOSIS — Z8679 Personal history of other diseases of the circulatory system: Secondary | ICD-10-CM | POA: Diagnosis not present

## 2024-06-17 DIAGNOSIS — G4733 Obstructive sleep apnea (adult) (pediatric): Secondary | ICD-10-CM | POA: Diagnosis not present

## 2024-06-17 DIAGNOSIS — N1831 Chronic kidney disease, stage 3a: Secondary | ICD-10-CM | POA: Diagnosis not present

## 2024-06-17 DIAGNOSIS — M154 Erosive (osteo)arthritis: Secondary | ICD-10-CM | POA: Diagnosis not present

## 2024-06-19 DIAGNOSIS — G4733 Obstructive sleep apnea (adult) (pediatric): Secondary | ICD-10-CM | POA: Diagnosis not present

## 2024-06-24 DIAGNOSIS — H2511 Age-related nuclear cataract, right eye: Secondary | ICD-10-CM | POA: Diagnosis not present

## 2024-06-24 DIAGNOSIS — Z79899 Other long term (current) drug therapy: Secondary | ICD-10-CM | POA: Diagnosis not present

## 2024-06-24 DIAGNOSIS — Z961 Presence of intraocular lens: Secondary | ICD-10-CM | POA: Diagnosis not present

## 2024-06-26 DIAGNOSIS — Z79899 Other long term (current) drug therapy: Secondary | ICD-10-CM | POA: Diagnosis not present

## 2024-06-26 DIAGNOSIS — M154 Erosive (osteo)arthritis: Secondary | ICD-10-CM | POA: Diagnosis not present

## 2024-06-26 DIAGNOSIS — E790 Hyperuricemia without signs of inflammatory arthritis and tophaceous disease: Secondary | ICD-10-CM | POA: Diagnosis not present

## 2024-07-19 ENCOUNTER — Ambulatory Visit
Admission: RE | Admit: 2024-07-19 | Discharge: 2024-07-19 | Disposition: A | Discharge status: Home / Self Care | Source: Ambulatory Visit | Attending: Internal Medicine | Admitting: Internal Medicine

## 2024-07-19 DIAGNOSIS — Z1231 Encounter for screening mammogram for malignant neoplasm of breast: Secondary | ICD-10-CM
